# Patient Record
Sex: Male | Born: 1970
Health system: Southern US, Community
[De-identification: ages and names within clinical notes are randomized; demographics above are authoritative.]

## PROBLEM LIST (undated history)

## (undated) DIAGNOSIS — F319 Bipolar disorder, unspecified: Secondary | ICD-10-CM

## (undated) DIAGNOSIS — D649 Anemia, unspecified: Secondary | ICD-10-CM

## (undated) DIAGNOSIS — J189 Pneumonia, unspecified organism: Secondary | ICD-10-CM

## (undated) DIAGNOSIS — F32A Depression, unspecified: Secondary | ICD-10-CM

## (undated) DIAGNOSIS — Z87891 Personal history of nicotine dependence: Secondary | ICD-10-CM

## (undated) DIAGNOSIS — F419 Anxiety disorder, unspecified: Secondary | ICD-10-CM

## (undated) DIAGNOSIS — K219 Gastro-esophageal reflux disease without esophagitis: Secondary | ICD-10-CM

## (undated) DIAGNOSIS — J869 Pyothorax without fistula: Secondary | ICD-10-CM

## (undated) DIAGNOSIS — F329 Major depressive disorder, single episode, unspecified: Secondary | ICD-10-CM

## (undated) DIAGNOSIS — F209 Schizophrenia, unspecified: Secondary | ICD-10-CM

## (undated) DIAGNOSIS — I10 Essential (primary) hypertension: Secondary | ICD-10-CM

## (undated) HISTORY — PX: OTHER SURGICAL HISTORY: SHX169

## (undated) HISTORY — DX: Pyothorax without fistula: J86.9

---

## 2011-12-04 ENCOUNTER — Emergency Department (HOSPITAL_COMMUNITY): Payer: BC Managed Care – PPO

## 2011-12-04 ENCOUNTER — Emergency Department (HOSPITAL_COMMUNITY)
Admission: EM | Admit: 2011-12-04 | Discharge: 2011-12-06 | Disposition: A | Discharge status: Home / Self Care | Payer: BC Managed Care – PPO | Attending: Emergency Medicine | Admitting: Emergency Medicine

## 2011-12-04 ENCOUNTER — Encounter (HOSPITAL_COMMUNITY): Payer: Self-pay | Admitting: *Deleted

## 2011-12-04 DIAGNOSIS — F1910 Other psychoactive substance abuse, uncomplicated: Secondary | ICD-10-CM | POA: Insufficient documentation

## 2011-12-04 DIAGNOSIS — F29 Unspecified psychosis not due to a substance or known physiological condition: Secondary | ICD-10-CM | POA: Insufficient documentation

## 2011-12-04 LAB — CBC WITH DIFFERENTIAL/PLATELET
Eosinophils Absolute: 0.2 10*3/uL (ref 0.0–0.7)
Eosinophils Relative: 3 % (ref 0–5)
Lymphs Abs: 2.2 10*3/uL (ref 0.7–4.0)
MCH: 33.3 pg (ref 26.0–34.0)
MCV: 90.8 fL (ref 78.0–100.0)
Monocytes Absolute: 0.5 10*3/uL (ref 0.1–1.0)
Monocytes Relative: 6 % (ref 3–12)
Platelets: 188 10*3/uL (ref 150–400)
RBC: 5.52 MIL/uL (ref 4.22–5.81)

## 2011-12-04 LAB — RAPID URINE DRUG SCREEN, HOSP PERFORMED
Amphetamines: NOT DETECTED
Barbiturates: NOT DETECTED
Tetrahydrocannabinol: POSITIVE — AB

## 2011-12-04 LAB — URINALYSIS, ROUTINE W REFLEX MICROSCOPIC
Bilirubin Urine: NEGATIVE
Hgb urine dipstick: NEGATIVE
Protein, ur: NEGATIVE mg/dL
Urobilinogen, UA: 0.2 mg/dL (ref 0.0–1.0)

## 2011-12-04 LAB — BASIC METABOLIC PANEL
BUN: 12 mg/dL (ref 6–23)
Calcium: 9.2 mg/dL (ref 8.4–10.5)
Creatinine, Ser: 1.07 mg/dL (ref 0.50–1.35)
GFR calc non Af Amer: 86 mL/min — ABNORMAL LOW (ref >90)
Glucose, Bld: 104 mg/dL — ABNORMAL HIGH (ref 70–99)
Sodium: 144 mEq/L (ref 135–145)

## 2011-12-04 LAB — SALICYLATE LEVEL: Salicylate Lvl: <2 mg/dL — ABNORMAL LOW (ref 2.8–20.0)

## 2011-12-04 LAB — ETHANOL: Alcohol, Ethyl (B): 158 mg/dL — ABNORMAL HIGH (ref 0–11)

## 2011-12-04 MED ORDER — ACETAMINOPHEN 325 MG PO TABS
650.0000 mg | ORAL_TABLET | ORAL | Status: DC | PRN
  Administered 2011-12-05: 650 mg via ORAL
  Filled 2011-12-04: qty 2

## 2011-12-04 MED ORDER — IBUPROFEN 200 MG PO TABS
600.0000 mg | ORAL_TABLET | Freq: Three times a day (TID) | ORAL | Status: DC | PRN
  Administered 2011-12-05 – 2011-12-06 (×3): 600 mg via ORAL
  Filled 2011-12-04 (×3): qty 3

## 2011-12-04 MED ORDER — IBUPROFEN 800 MG PO TABS: 800.0000 mg | ORAL_TABLET | Freq: Once | ORAL | Status: DC

## 2011-12-04 MED ORDER — SODIUM CHLORIDE 0.9 % IV BOLUS (SEPSIS)
1000.0000 mL | Freq: Once | INTRAVENOUS | Status: AC
  Administered 2011-12-04: 1000 mL via INTRAVENOUS

## 2011-12-04 MED ORDER — ONDANSETRON HCL 8 MG PO TABS: 4.0000 mg | ORAL_TABLET | Freq: Three times a day (TID) | ORAL | Status: DC | PRN

## 2011-12-04 MED ORDER — ALUM & MAG HYDROXIDE-SIMETH 200-200-20 MG/5ML PO SUSP: 30.0000 mL | ORAL | Status: DC | PRN

## 2011-12-04 MED ORDER — LORAZEPAM 2 MG/ML IJ SOLN
1.0000 mg | Freq: Once | INTRAMUSCULAR | Status: AC
  Administered 2011-12-04: 1 mg via INTRAVENOUS
  Filled 2011-12-04: qty 1

## 2011-12-04 MED ORDER — ZOLPIDEM TARTRATE 5 MG PO TABS
5.0000 mg | ORAL_TABLET | Freq: Every evening | ORAL | Status: DC | PRN
  Administered 2011-12-04: 5 mg via ORAL
  Filled 2011-12-04: qty 1

## 2011-12-04 NOTE — ED Notes (Signed)
Family at bedside.  Pt. Stable, no change in his status.

## 2011-12-04 NOTE — ED Notes (Signed)
Went to see patient to notify of discharge. Pt continues with altered mental status. Does not talk, shaking upper extremities, not following commands. Dr. Alisa Graff made aware patient not able to be discharged at this time.

## 2011-12-04 NOTE — ED Notes (Signed)
Verl Bangs MD deciding whether patient is CDU appropriate

## 2011-12-04 NOTE — ED Notes (Signed)
CBG checked 94 

## 2011-12-04 NOTE — ED Provider Notes (Signed)
Assumed pt care in CDU.  Pt with altered mental status who was initially evaluated by Dr. Verl Bangs.  Pt apparently only wants to communicate through writing, although able to make verbal sounds.  He has an alcohol level of 158 and +marijuana in UDS.  Does not appears intoxicated but unsure his baseline.  Plan to monitor for 4-6 hrs and will d/c pending mental status condition.    7:55 AM Pt able to communicate through writing that he has experienced this particular sxs once before, lasting less than a day.  He is stressed. he is not drunk.  He prefers no drugs, he is not in pain, and he would like for [place] to contact his family at [phone].  On exam, pt appears tearful but alert.  Is tachycardic without M/R/G, tachypneic on exam without W/R/R, abd nontender, able to move all extremity with normal strength.  No trauma noted.    8:07 AM Attempt to contact family member via phone but was unsuccessful.  Will try again.  So far pt's labs are reassuring.  Will continue to monitor.  I suspect pt is having a mental breakdown.  8:38 AM I was able to talk to pt's girlfriend, who is in the waiting room.  According to girlfriend, pt did had 2 beers tonight but has been stressed out about exwife and custody of his kid.  Girlfriend does not acknowledge any SI/HI intent from patient.  However, he was "acting strange and noncommunicative" therefore she brought him to ER.  Girlfriend also sts pt is extremely sensitive to all medication and thinks Ativan that was given in ER seems to worsening his mental status.  However, she admits he has not behave himself prior to come to ER.  We will continue monitoring as appropriate.    Pt currently sleeping, VSS.    9:35 AM Pt appears irrated, and request for [place] to contact his mom, [phone].  I was able to successfully reached pt's mother through phone.  She admits pt has been under a lot of stress. She agrees to come to ER to see him.  Pt were notified.    10:18  AM Pt's parent is currently at bedside.  They acknowledge that pt is very sensitive to all medications, and also has "neuro-synapsis depression" disorder.  Also acknowledge that pt has been under a lot of mental stress.  Sts he used to be on psych medication which has helped tremendously.  Unsure which medicine.  Sts pt no longer f/u with psych.  However, pt has expressed "im loosing it" to mom recently in regard to stressed about finance, ex wife, shoulder pain despite surgeries, etc... I reassure family that we will monitor pt closely.  I also offer psych-eval through our [facility], once pt can verbalize.    11:53 AM   I have consulted with ACT team, who agrees to see pt for further management. Pt likely will need telepsych for further evaluation.    12:24 PM ACT team has seen and evaluate pt but were unable to obtain any hx from him.  They plan to consult telepsych.  Family member notify that pt's eyelid appears puffy.  On exam, eyelids are edematous bilaterally. No airway compromise. Lung's CTAB.  Pt appears to be in pain and holding R shoulder.  Non communicative.  Will give ibuprofen.    2:02 PM My attending has seen and evaluated the patient.  Our plan is to consult with ACT team for psych eval and placement.  Pt not  safe to be discharge.    2:54 PM Pt will be transfer to Psych ED for further management.    Results for orders placed during the hospital encounter of 12/04/11  CBC WITH DIFFERENTIAL      Component Value Range   WBC 7.8  4.0 - 10.5 K/uL   RBC 5.52  4.22 - 5.81 MIL/uL   Hemoglobin 18.4 (*) 13.0 - 17.0 g/dL   HCT 16.1  09.6 - 04.5 %   MCV 90.8  78.0 - 100.0 fL   MCH 33.3  26.0 - 34.0 pg   MCHC 36.7 (*) 30.0 - 36.0 g/dL   RDW 40.9  81.1 - 91.4 %   Platelets 188  150 - 400 K/uL   Neutrophils Relative 63  43 - 77 %   Neutro Abs 4.9  1.7 - 7.7 K/uL   Lymphocytes Relative 28  12 - 46 %   Lymphs Abs 2.2  0.7 - 4.0 K/uL   Monocytes Relative 6  3 - 12 %   Monocytes Absolute 0.5   0.1 - 1.0 K/uL   Eosinophils Relative 3  0 - 5 %   Eosinophils Absolute 0.2  0.0 - 0.7 K/uL   Basophils Relative 0  0 - 1 %   Basophils Absolute 0.0  0.0 - 0.1 K/uL  BASIC METABOLIC PANEL      Component Value Range   Sodium 144  135 - 145 mEq/L   Potassium 3.2 (*) 3.5 - 5.1 mEq/L   Chloride 104  96 - 112 mEq/L   CO2 25  19 - 32 mEq/L   Glucose, Bld 104 (*) 70 - 99 mg/dL   BUN 12  6 - 23 mg/dL   Creatinine, Ser 7.82  0.50 - 1.35 mg/dL   Calcium 9.2  8.4 - 95.6 mg/dL   GFR calc non Af Amer 86 (*) >90 mL/min   GFR calc Af Amer >90  >90 mL/min  URINALYSIS, ROUTINE W REFLEX MICROSCOPIC      Component Value Range   Color, Urine YELLOW  YELLOW   APPearance CLEAR  CLEAR   Specific Gravity, Urine 1.011  1.005 - 1.030   pH 6.0  5.0 - 8.0   Glucose, UA NEGATIVE  NEGATIVE mg/dL   Hgb urine dipstick NEGATIVE  NEGATIVE   Bilirubin Urine NEGATIVE  NEGATIVE   Ketones, ur NEGATIVE  NEGATIVE mg/dL   Protein, ur NEGATIVE  NEGATIVE mg/dL   Urobilinogen, UA 0.2  0.0 - 1.0 mg/dL   Nitrite NEGATIVE  NEGATIVE   Leukocytes, UA NEGATIVE  NEGATIVE  URINE RAPID DRUG SCREEN (HOSP PERFORMED)      Component Value Range   Opiates NONE DETECTED  NONE DETECTED   Cocaine NONE DETECTED  NONE DETECTED   Benzodiazepines NONE DETECTED  NONE DETECTED   Amphetamines NONE DETECTED  NONE DETECTED   Tetrahydrocannabinol POSITIVE (*) NONE DETECTED   Barbiturates NONE DETECTED  NONE DETECTED  ACETAMINOPHEN LEVEL      Component Value Range   Acetaminophen (Tylenol), Serum <15.0  10 - 30 ug/mL  SALICYLATE LEVEL      Component Value Range   Salicylate Lvl <2.0 (*) 2.8 - 20.0 mg/dL  ETHANOL      Component Value Range   Alcohol, Ethyl (B) 158 (*) 0 - 11 mg/dL  GLUCOSE, CAPILLARY      Component Value Range   Glucose-Capillary 94  70 - 99 mg/dL  ETHANOL      Component Value Range   Alcohol, Ethyl (  B) 77 (*) 0 - 11 mg/dL   Ct Head Wo Contrast  12/04/2011  *RADIOLOGY REPORT*  Clinical Data:  Unresponsive patient.   Altered mental status.  CT HEAD WITHOUT CONTRAST CT CERVICAL SPINE WITHOUT CONTRAST  Technique:  Multidetector CT imaging of the head and cervical spine was performed following the standard protocol without intravenous contrast.  Multiplanar CT image reconstructions of the cervical spine were also generated.  Comparison:   None  CT HEAD  Findings: No mass lesion, mass effect, midline shift, hydrocephalus, hemorrhage.  No territorial ischemia or acute infarction.  Mastoid air cells and paranasal sinuses appear within normal limits.  IMPRESSION: Negative CT head.  CT CERVICAL SPINE  Findings: Anatomic alignment of the cervical spine.  Occipital condyles appear within normal limits.  There is no cervical spine fracture or dislocation.  Small areas of heterotopic bone present adjacent to the spinous processes.  Lung apices appear within normal limits.  IMPRESSION: Negative cervical spine CT.  Original Report Authenticated By: Andreas Newport, M.D.   Ct Cervical Spine Wo Contrast  12/04/2011  *RADIOLOGY REPORT*  Clinical Data:  Unresponsive patient.  Altered mental status.  CT HEAD WITHOUT CONTRAST CT CERVICAL SPINE WITHOUT CONTRAST  Technique:  Multidetector CT imaging of the head and cervical spine was performed following the standard protocol without intravenous contrast.  Multiplanar CT image reconstructions of the cervical spine were also generated.  Comparison:   None  CT HEAD  Findings: No mass lesion, mass effect, midline shift, hydrocephalus, hemorrhage.  No territorial ischemia or acute infarction.  Mastoid air cells and paranasal sinuses appear within normal limits.  IMPRESSION: Negative CT head.  CT CERVICAL SPINE  Findings: Anatomic alignment of the cervical spine.  Occipital condyles appear within normal limits.  There is no cervical spine fracture or dislocation.  Small areas of heterotopic bone present adjacent to the spinous processes.  Lung apices appear within normal limits.  IMPRESSION: Negative  cervical spine CT.  Original Report Authenticated By: Andreas Newport, M.D.      Fayrene Helper, PA-C 12/04/11 1454

## 2011-12-04 NOTE — ED Notes (Signed)
Pt from front of ER fell backwards out of the car, not responding. Per family pt was drinking and went to car lie down. Upon arrival pupils fixed and dilated, pt with tremors, not responding to verbal, tactile stimuli. Eyes red. Extremities cold.  c-collar placed.

## 2011-12-04 NOTE — ED Notes (Signed)
Patient is resting comfortably. 

## 2011-12-04 NOTE — Progress Notes (Signed)
Responded to pg to intervene with family.  Introduced myself to pt and family in rm- Mother, father, brother, fiance.  Fiance tearful, pt resting and quiet.  Offered pastoral discussion @ keeping pt environment calm, and offered use of family room to help them process their own feelings.  Fiance and mother agreed and we talked in consult rm C about letting pt have some space and calm to rest based on family stories.  Mother and fiance agreed that fiance would get keys and go home for a while.  On return, family pastor was also in room.  Advised staff of intervention and plan for some family to leave.  Will follow-up as needed or requested.

## 2011-12-04 NOTE — ED Provider Notes (Addendum)
History     CSN: [id number]  Arrival date & time 12/04/11  4098   First MD Initiated Contact with Patient 12/04/11 0304      Chief Complaint  Patient presents with  . Altered Mental Status    (Consider location/radiation/quality/duration/timing/severity/associated sxs/prior treatment) Patient is a 41 y.o. male presenting with altered mental status. The history is limited by the condition of the patient.  Altered Mental Status This is a new problem. The current episode started less than 1 hour ago. The problem occurs constantly. The problem has not changed since onset.Associated symptoms comments: Pt with fall from car,  Odor of etoh.  Altered, mubling words.    No past medical history on file.  No past surgical history on file.  No family history on file.  History  Substance Use Topics  . Smoking status: Not on file  . Smokeless tobacco: Not on file  . Alcohol Use: Not on file      Review of Systems  Unable to perform ROS: Mental status change  Psychiatric/Behavioral: Positive for altered mental status.    Allergies  Review of patient's allergies indicates not on file.  Home Medications  No current outpatient prescriptions on file.  BP 132/83  Pulse 87  Resp 13  SpO2 100%  Physical Exam  Constitutional: He appears well-developed and well-nourished. He appears lethargic.  HENT:  Head: Normocephalic and atraumatic.  Eyes: Conjunctivae are normal. Pupils are equal, round, and reactive to light.  Neck: Normal range of motion. Neck supple.  Cardiovascular: Normal rate, regular rhythm, normal heart sounds and intact distal pulses.   Pulmonary/Chest: Effort normal and breath sounds normal.  Abdominal: Soft. Bowel sounds are normal.  Neurological: He appears lethargic. He displays no atrophy and no tremor. He exhibits normal muscle tone. He displays no seizure activity. GCS eye subscore is 4. GCS verbal subscore is 3. GCS motor subscore is 6.  Skin: Skin is warm  and dry.  Psychiatric: He has a normal mood and affect. His behavior is normal. Judgment and thought content normal.    ED Course  Procedures (including critical care time)  Labs Reviewed  CBC WITH DIFFERENTIAL - Abnormal; Notable for the following:    Hemoglobin 18.4 (*)     MCHC 36.7 (*)  CORRECTED FOR COLD AGGLUTININS   All other components within normal limits  URINALYSIS, ROUTINE W REFLEX MICROSCOPIC  BASIC METABOLIC PANEL  URINE RAPID DRUG SCREEN (HOSP PERFORMED)  DRUG SCREEN PANEL (SERUM)  ACETAMINOPHEN LEVEL  SALICYLATE LEVEL  ETHANOL   No results found.   No diagnosis found.   Date: 12/04/2011  Rate: 94  Rhythm: normal sinus rhythm  QRS Axis: normal  Intervals: normal  ST/T Wave abnormalities: normal  Conduction Disutrbances: none  Narrative Interpretation: unremarkable     MDM  + ams of unkown etiology.  Placed on immobilization.  Await ct, labs.  Some purposeful movement noted. Will reassess   Improved. Pt still no vocalizing,  Will repeat etoh,  Reeval,  Hold in cdu     Allean Montfort Lytle Michaels, MD 12/04/11 0710  Dontavis Tschantz Lytle Michaels, MD 12/04/11 [phone]

## 2011-12-04 NOTE — ED Notes (Signed)
No old EKG. New EKG given to Dr Verl Bangs

## 2011-12-04 NOTE — ED Notes (Addendum)
Per fiance: Pt was at a [employer] and was not feeling well after he had been drinking.  He went to the car to lay down. When they came out of the club awhile later he was nonverbal and unresponsive .  Pt is awake. Nonverbal. Seizure like behavior. Pt hold's is breath when frustrated. Pt can right answers to simple questions and short sentences. Pt denies any drug use. Unable to verbalize allergies, medical history, or pain. Reports SOB. SpO2 100% on 2L [place]. Fiance in waiting room. Cousin at bedside.  Safety sitter at bedside.

## 2011-12-04 NOTE — ED Notes (Signed)
Pt. Continues to have no change in status, vitals stable, pt. Is alert and oriented to self and place.,  but will not communicate verbally only will write.

## 2011-12-04 NOTE — ED Notes (Signed)
Patient belongings including shorts, shoes, socks, wallet, and watch at bedside in pt. Belonging bag.

## 2011-12-04 NOTE — ED Notes (Signed)
Pt. Arrived from Pod B.  Placed on monitor, vitals stable.  Pt. Is alert and oriented, follows commands. Unable to speak.  Pt. Verbalizes needs by writing.  Pt. Denies any pain.  Pt. Does shake his head yes  when asked if he can speak.   Will continue to monitor pt. Pt. Is in NAD and skin is w/d/p.

## 2011-12-04 NOTE — ED Provider Notes (Signed)
Medical screening examination/treatment/procedure(s) were conducted as a shared visit with non-physician practitioner(s) and myself.  I personally evaluated the patient during the encounter Patient's with the symptoms that is most suggestive of psychiatric at this time.  Initially when he was seen he had a workup including labs and head CT which were all within normal limits. Patient was positive for alcohol and marijuana. However as time has gone on patient initially would only write to communicate and refused to speak, but now patient measures his in to speak the right. Per his family he has had severe depression in the past requiring antidepressants. Recently his ex-wife remarried and is going back on her verbal agreements with their children. The patient told his mother he thought he may be having a breakdown. At this point feel his symptomatology is due to psychiatric and not neurologic or medical findings. Will attempt placement for the patient for further psychiatric treatment  Gwyneth Sprout, MD 12/04/11 1514

## 2011-12-04 NOTE — BH Assessment (Addendum)
Assessment Note   Jeremy Dillon is an 41 y.o. male that presented to [facility] after the pt not acting like himself per pt's girlfriend.  Per pt's family he has been under stress from a previous divorce, his ex-wife and custody of his kids.  Pt presented after haing 2 beers per girlfriend and having smoked marijuana 3 days ago per girlfriend for ongoing shoulder pain.  Writer was asked to see pt once medically cleared.  Pt was unresponsive, appeared bizarre and was rolling his eyes around when writer attempted to assess pt.  All of the information gathered was from the mother and from previous notes.  Pt was writing responses on a board earlier, refusing to talk, but now, not responding to anyone at all.  Per pt's mother, pt had an episode like this 2 years ago when pt was getting a divorce.  Per mother, pt has been diagnosed with "neourolgical depression," and has not had any MH treatment before other than getting psychotropic meds from an unknown provider 2 years ago while going through divorce.   Consulted with PA Fayrene Helper, who agreed a telepsych was appropriate for further evaluation and recommendations.  Consulted with EDP Plunkett, who feels inpatient recommendation is appropriate.  Will run for possible admission.  Telepsych will not be completed, as pt not talking.  Completed assessment, assessment notification and faxed to [facility] to log.  Pt pending [facility].  Updated ED staff.  Axis I: Anxiety Disorder NOS Axis II: Deferred Axis III: No past medical history on file. Axis IV: other psychosocial or environmental problems and problems with primary support group Axis V: 21-30 behavior considerably influenced by delusions or hallucinations OR serious impairment in judgment, communication OR inability to function in almost all areas  Past Medical History: No past medical history on file.  No past surgical history on file.  Family History: No family history on file.  Social History:  does not have a  smoking history on file. He does not have any smokeless tobacco history on file. He reports that he drinks alcohol. He reports that he uses illicit drugs (Marijuana).  Additional Social History:  Alcohol / Drug Use Pain Medications: na Prescriptions: na Over the Counter: na History of alcohol / drug use?:  (unknown - pt drank 2 beers, smoked THC 3 days ago per family) Longest period of sobriety (when/how long): unknown Negative Consequences of Use:  (unknown) Withdrawal Symptoms:  (UTA)  CIWA: CIWA-Ar BP: 140/84 mmHg Pulse Rate: 82  COWS:    Allergies:  Allergies  Allergen Reactions  . Erythromycin     Cannot take any mycins  . Penicillins     Cannot take any cillins  . Sulfa Antibiotics     Cannot take any sulfa drugs    Home Medications:  (Not in a hospital admission)  OB/GYN Status:  No LMP for male patient.  General Assessment Data Location of Assessment: [facility] ED Living Arrangements: Spouse/significant other Can pt return to current living arrangement?: Yes Admission Status: Voluntary Is patient capable of signing voluntary admission?: Yes Transfer from: Acute Hospital Referral Source: Self/Family/Friend  Education Status Is patient currently in school?: No  Risk to self Suicidal Ideation:  (UTA) Suicidal Intent:  (UTA) Is patient at risk for suicide?:  (UTA) Suicidal Plan?:  (UTA) Access to Means:  (UTA) What has been your use of drugs/alcohol within the last 12 months?: Pt had 2 beers last night, used THC 3 days ago per family Previous Attempts/Gestures: No (per family)  How many times?: 0  (per family) Other Self Harm Risks: UTA Triggers for Past Attempts: Unknown Intentional Self Injurious Behavior:  (UTA) Family Suicide History: No (per mother) Recent stressful life event(s): Conflict (Comment);Divorce;Turmoil (Comment) (divorce issues with wife and kids, under stress from this) Persecutory voices/beliefs?:  (UTA) Depression: Yes Depression Symptoms:   (UTA - pt has depression per parents) Substance abuse history and/or treatment for substance abuse?: No Suicide prevention information given to non-admitted patients:  (UTA)  Risk to Others Homicidal Ideation:  (UTA) Thoughts of Harm to Others:  (UTA) Current Homicidal Intent:  (UTA) Current Homicidal Plan:  (UTA) Access to Homicidal Means:  (UTA) Identified Victim:  (UTA) History of harm to others?:  (UTA) Assessment of Violence: None Noted Violent Behavior Description: na - pt not talking, uncommunicative Does patient have access to weapons?:  (Unknown) Criminal Charges Pending?: No (per pt's mother) Does patient have a court date: No (per pt's mother)  Psychosis Hallucinations:  (UTA) Delusions:  (UTA)  Mental Status Report Appear/Hygiene: Bizarre Eye Contact: Other (Comment) (None - pt rolling around eyes in head) Motor Activity: Psychomotor retardation Speech: Unable to assess Level of Consciousness: Unable to assess Mood: Other (Comment) (UTA) Affect: Unable to Assess Anxiety Level:  (UTA) Thought Processes:  (UTA) Judgement:  (UTA) Orientation: Unable to assess Obsessive Compulsive Thoughts/Behaviors:  (UTA)  Cognitive Functioning Concentration:  (UTA) Memory:  (UTA) IQ:  (UTA) Insight:  (UTA) Impulse Control:  (UTA) Appetite:  (UTA) Weight Loss:  (UTA) Weight Gain:  (UTA) Sleep:  (UTA) Total Hours of Sleep:  (UTA) Vegetative Symptoms:  (UTA)  ADLScreening [facility] Assessment Services) Patient's cognitive ability adequate to safely complete daily activities?: Yes Patient able to express need for assistance with ADLs?: Yes Independently performs ADLs?: Yes (appropriate for developmental age)  Abuse/Neglect [facility]) Physical Abuse: Denies (pt's mother denies) Verbal Abuse: Denies (pt's mother denies) Sexual Abuse: Denies (pt's mother denies)  Prior Inpatient Therapy Prior Inpatient Therapy: No (per mother) Prior Therapy Dates: na Prior Therapy  Facilty/Provider(s): na Reason for Treatment: na  Prior Outpatient Therapy Prior Outpatient Therapy: Yes Prior Therapy Dates: 2 years ago Prior Therapy Facilty/Provider(s): Unknown provider Reason for Treatment: Depression per mother - pt given psychotropic medication  ADL Screening (condition at time of admission) Patient's cognitive ability adequate to safely complete daily activities?: Yes Patient able to express need for assistance with ADLs?: Yes Independently performs ADLs?: Yes (appropriate for developmental age) Weakness of Legs: None Weakness of Arms/Hands: None  Home Assistive Devices/Equipment Home Assistive Devices/Equipment: None    Abuse/Neglect Assessment (Assessment to be complete while patient is alone) Physical Abuse: Denies (pt's mother denies) Verbal Abuse: Denies (pt's mother denies) Sexual Abuse: Denies (pt's mother denies) Exploitation of patient/patient's resources: Denies (pt's mother denies) Self-Neglect: Denies (pt's mother denies) Possible abuse reported to::  (na) Values / Beliefs Cultural Requests During Hospitalization:  (UTA) Spiritual Requests During Hospitalization:  (UTA) Consults Spiritual Care Consult Needed:  (unknown) Social Work Librarian, academic Needed:  (unknown) Merchant navy officer (For Healthcare) Advance Directive: Patient does not have advance directive    Additional Information 1:1 In Past 12 Months?:  (UTA) CIRT Risk:  (UTA) Elopement Risk:  (UTA) Does patient have medical clearance?: Yes     Disposition:  Pending [facility]   On Site Evaluation by:   Reviewed with Physician:  PA Greta Doom Tran/Plunkett   Caryl Comes 12/04/2011 12:52 PM

## 2011-12-05 MED ORDER — SERTRALINE HCL 25 MG PO TABS
25.0000 mg | ORAL_TABLET | Freq: Every day | ORAL | Status: DC
  Administered 2011-12-05 – 2011-12-06 (×2): 25 mg via ORAL
  Filled 2011-12-05 (×2): qty 1

## 2011-12-05 MED ORDER — LORAZEPAM 2 MG/ML IJ SOLN: 1.0000 mg | Freq: Two times a day (BID) | INTRAMUSCULAR | Status: DC | PRN

## 2011-12-05 MED ORDER — RISPERIDONE 0.5 MG PO TABS
0.5000 mg | ORAL_TABLET | Freq: Two times a day (BID) | ORAL | Status: DC
  Administered 2011-12-05 – 2011-12-06 (×3): 0.5 mg via ORAL
  Filled 2011-12-05 (×3): qty 1

## 2011-12-05 MED ORDER — LORAZEPAM 1 MG PO TABS
1.0000 mg | ORAL_TABLET | Freq: Two times a day (BID) | ORAL | Status: DC | PRN
  Administered 2011-12-05 (×2): 1 mg via ORAL
  Filled 2011-12-05 (×2): qty 1

## 2011-12-05 NOTE — ED Notes (Signed)
In to inform pt of move and pt father seems anxious. Tells staff "dont turn the light on it sets him off". Pt continues not speaking. He does have eye contact when talked to but barely nods acknowlegement of understanding. Pt placed in paper scrubs by ida the tech. Pt compliant. Pt and his father moved to behavioral health area of ed. (pod c)

## 2011-12-05 NOTE — BH Assessment (Signed)
[facility] Assessment Progress Note      Pt's father requested that we be aware that a more detailed history can be provided by past psychiatric providers.  These are two: Dr. Marjory Lies-  [phone] And Dr. Loretha Stapler-  [phone]

## 2011-12-05 NOTE — ED Notes (Signed)
Patient's girlfriend at bedside patient currently is sleeping resting comfortably.

## 2011-12-05 NOTE — ED Notes (Signed)
Discussed with father and patient. Able to given motrin every 8 hours and tylenol every 4 hours.

## 2011-12-05 NOTE — ED Notes (Signed)
Spoke with ed. Will accept pt into room 26

## 2011-12-05 NOTE — ED Notes (Signed)
Family at bedside. 

## 2011-12-05 NOTE — ED Provider Notes (Signed)
Jeremy Dillon is a 41 y.o. male who is here for placement. He continues to be nonverbal. He attempts to converse with extending and hand motions. His friend is here in the room with him.  He has been seen by [facility] psychiatry, who advises starting on Zoloft, Ativan, and risperidone. They also recommend psychiatric admission  Flint Melter, MD 12/05/11 1422

## 2011-12-05 NOTE — ED Notes (Signed)
Patient awake requested tylenol when nurse asked if wanting tylenol patient nodded head up and down.

## 2011-12-05 NOTE — ED Notes (Signed)
Patient's girlfriend at bedside and patient texted girlfriend pain present.  Asked patient if he would want tylenol at this time patient nodded head up and down.

## 2011-12-05 NOTE — ED Notes (Signed)
Patient sleeping and girlfriend at bedside stated not to wake up patient at this time.

## 2011-12-05 NOTE — ED Notes (Signed)
Family member called spoke with patient's father who is at bedside.

## 2011-12-05 NOTE — ED Notes (Signed)
Patient father at bedside stated patient has history of right shoulder pain.  When asked if patient has pain patient nodded head up and down.  Skin warm and intact radial pulse +2. Patient has full range of motion.

## 2011-12-05 NOTE — BH Assessment (Signed)
Assessment Note   Jeremy Dillon is an 41 y.o. male. Upon reassessment and Telepsych results, pt remains unstable and is not psychiatrically cleared.  Pt remains electively mute and only corresponds through his Father or a by written communication.  Pt is irritable when awakened and is easy to startle and irritate.  Pt's family remains demanding and sure that they know what is best for pt.  They are refusing to consider [facility] as an option and have questioned his disposition repeatedly throughout the morning.  Pt remains on the list to be run at [facility].  Telespysch results were also provided.      Jeremy Dillon is an 41 y.o. male that presented to [facility] after the pt not acting like himself per pt's girlfriend. Per pt's family he has been under stress from a previous divorce, his ex-wife and custody of his kids. Pt presented after haing 2 beers per girlfriend and having smoked marijuana 3 days ago per girlfriend for ongoing shoulder pain. Writer was asked to see pt once medically cleared. Pt was unresponsive, appeared bizarre and was rolling his eyes around when writer attempted to assess pt. All of the information gathered was from the mother and from previous notes. Pt was writing responses on a board earlier, refusing to talk, but now, not responding to anyone at all. Per pt's mother, pt had an episode like this 2 years ago when pt was getting a divorce. Per mother, pt has been diagnosed with "neourolgical depression," and has not had any MH treatment before other than getting psychotropic meds from an unknown provider 2 years ago while going through divorce. Consulted with PA Fayrene Helper, who agreed a telepsych was appropriate for further evaluation and recommendations. Consulted with EDP Plunkett, who feels inpatient recommendation is appropriate. Will run for possible admission. Telepsych will not be completed, as pt not talking. Completed assessment, assessment notification and faxed to [facility] to log. Pt  pending [facility]. Updated ED staff.   Axis I: Unspecified Psychosis Axis II: Deferred Axis III: No past medical history on file. Axis IV: other psychosocial or environmental problems and problems related to social environment Axis V: 21-30 behavior considerably influenced by delusions or hallucinations OR serious impairment in judgment, communication OR inability to function in almost all areas  Past Medical History: No past medical history on file.  No past surgical history on file.  Family History: No family history on file.  Social History:  does not have a smoking history on file. He does not have any smokeless tobacco history on file. He reports that he drinks alcohol. He reports that he uses illicit drugs (Marijuana).  Additional Social History:  Alcohol / Drug Use Pain Medications: na Prescriptions: na Over the Counter: na History of alcohol / drug use?:  (unknown - pt drank 2 beers, smoked THC 3 days ago per family) Longest period of sobriety (when/how long): unknown Negative Consequences of Use:  (unknown) Withdrawal Symptoms:  (UTA)  CIWA: CIWA-Ar BP: 123/68 mmHg Pulse Rate: 69  COWS:    Allergies:  Allergies  Allergen Reactions  . Erythromycin     Cannot take any mycins  . Penicillins     Cannot take any cillins  . Sulfa Antibiotics     Cannot take any sulfa drugs    Home Medications:  (Not in a hospital admission)  OB/GYN Status:  No LMP for male patient.  General Assessment Data Location of Assessment: [facility] ED Living Arrangements: Spouse/significant other Can pt return to  current living arrangement?: Yes Admission Status: Voluntary Is patient capable of signing voluntary admission?: Yes Transfer from: Acute Hospital Referral Source: Self/Family/Friend  Education Status Is patient currently in school?: No  Risk to self Suicidal Ideation: No Suicidal Intent: No (UTA) Is patient at risk for suicide?: No Suicidal Plan?: No Access to Means: No What  has been your use of drugs/alcohol within the last 12 months?: has been drinking and smoking Marijuana Previous Attempts/Gestures: No How many times?: 0  Other Self Harm Risks: unpredictable Triggers for Past Attempts: Unpredictable Intentional Self Injurious Behavior: None Family Suicide History: No Recent stressful life event(s): Conflict (Comment);Divorce;Loss (Comment);Turmoil (Comment) Persecutory voices/beliefs?: No Depression: Yes Depression Symptoms: Loss of interest in usual pleasures;Feeling worthless/self pity Substance abuse history and/or treatment for substance abuse?: No Suicide prevention information given to non-admitted patients: Not applicable  Risk to Others Homicidal Ideation: No Thoughts of Harm to Others: No Current Homicidal Intent: No Current Homicidal Plan: No Access to Homicidal Means: No Identified Victim: none History of harm to others?: No Assessment of Violence: None Noted Violent Behavior Description: n/a Does patient have access to weapons?: No Criminal Charges Pending?: No Does patient have a court date: No  Psychosis Hallucinations: None noted Delusions: None noted  Mental Status Report Appear/Hygiene: Bizarre Eye Contact: Good Motor Activity: Psychomotor retardation;Freedom of movement Speech: Elective mutism Level of Consciousness: Unable to assess Mood: Depressed;Empty;Preoccupied;Worthless, low self-esteem Affect: Unable to Assess Anxiety Level: Moderate Thought Processes: Irrelevant Judgement: Impaired Orientation: Unable to assess Obsessive Compulsive Thoughts/Behaviors: Severe  Cognitive Functioning Concentration: Decreased Memory: Recent Impaired;Remote Impaired IQ: Average Insight: Poor Impulse Control: Poor Appetite: Fair Weight Loss: 0  Weight Gain: 0  Sleep: No Change Total Hours of Sleep:  (unknown) Vegetative Symptoms: Decreased grooming;Staying in bed  ADLScreening [facility] Assessment Services) Patient's  cognitive ability adequate to safely complete daily activities?: Yes Patient able to express need for assistance with ADLs?: Yes Independently performs ADLs?: Yes (appropriate for developmental age)  Abuse/Neglect [facility]) Physical Abuse: Denies Verbal Abuse: Denies Sexual Abuse: Denies  Prior Inpatient Therapy Prior Inpatient Therapy: No Prior Therapy Dates: na Prior Therapy Facilty/Provider(s): na Reason for Treatment: na  Prior Outpatient Therapy Prior Outpatient Therapy: Yes Prior Therapy Dates: 2 years ago Prior Therapy Facilty/Provider(s): Unknown provider Reason for Treatment: Depression per mother - pt given psychotropic medication  ADL Screening (condition at time of admission) Patient's cognitive ability adequate to safely complete daily activities?: Yes Patient able to express need for assistance with ADLs?: Yes Independently performs ADLs?: Yes (appropriate for developmental age) Weakness of Legs: None Weakness of Arms/Hands: None  Home Assistive Devices/Equipment Home Assistive Devices/Equipment: None    Abuse/Neglect Assessment (Assessment to be complete while patient is alone) Physical Abuse: Denies Verbal Abuse: Denies Sexual Abuse: Denies Exploitation of patient/patient's resources: Denies (pt's mother denies) Self-Neglect: Denies (pt's mother denies) Possible abuse reported to::  (na) Values / Beliefs Cultural Requests During Hospitalization:  (UTA) Spiritual Requests During Hospitalization:  (UTA) Consults Spiritual Care Consult Needed:  (unknown) Social Work Librarian, academic Needed:  (unknown) Merchant navy officer (For Healthcare) Advance Directive: Patient does not have advance directive    Additional Information 1:1 In Past 12 Months?: Yes CIRT Risk: No Elopement Risk: No Does patient have medical clearance?: Yes     Disposition: To be determined by [facility]. Disposition Disposition of Patient: Referred to;Inpatient treatment program Type of inpatient  treatment program: Adult Other disposition(s): Other (Comment) (Pending [facility]) Patient referred to: Other (Comment) ([facility]ending [facility])  On Site Evaluation by:   Reviewed  with Physician:     Angelica Ran 12/05/2011 3:16 PM

## 2011-12-05 NOTE — ED Notes (Signed)
Patient having tele psych with father at bedside

## 2011-12-05 NOTE — ED Notes (Signed)
Patient's father requested to placed oxygen [place] on patient.  Patient wrote trouble breathing. Pulses ox room air 99% no distress airway intact bilateral equal chest rise and fall.

## 2011-12-06 MED ORDER — RISPERIDONE 0.5 MG PO TABS: 0.5000 mg | ORAL_TABLET | Freq: Two times a day (BID) | ORAL | Status: DC

## 2011-12-06 MED ORDER — SERTRALINE HCL 25 MG PO TABS: 25.0000 mg | ORAL_TABLET | Freq: Every day | ORAL | Status: DC

## 2011-12-06 NOTE — ED Notes (Signed)
Resting quietly voices no complaints at this time. 

## 2011-12-06 NOTE — ED Notes (Signed)
Dr. Freida Busman and Berna Spare with ACT Team in to see the patient.

## 2011-12-06 NOTE — ED Notes (Addendum)
Patient now awake alert and talking eating well girlfriend present. Patient states 'I am feeling better." Up to restroom and he tolerated well. Dr. Freida Busman notified of the change.

## 2011-12-06 NOTE — ED Notes (Signed)
Patient discharged with his girlfriend. NAD noted at the time of discharge.

## 2011-12-06 NOTE — ED Provider Notes (Cosign Needed Addendum)
Pt started on meds yesterday for his psychiatyric condition, vitals stable, awaiting placment  Jeremy Baker, MD 12/06/11 0748  1:54 PM Pt now awake and alert. His family states that he is at baseline. Will continue on his current meds and pt to follow up with dr. Jerald Kief next week. He will sign a no harm contract  Jeremy Baker, MD 12/06/11 1356

## 2011-12-06 NOTE — ED Notes (Signed)
Patient appears to be sleeping father at bedside.

## 2011-12-06 NOTE — BH Assessment (Signed)
Assessment Note   Jeremy Dillon is an 41 y.o. male.  Patient was seen by this clinician and Jeremy Dillon (Jeremy).  Patient appears to be alert and oriented.  He is able to contract for safety.  He is surrounded by family who promise that they will assist him with follow up discussed.  Patient said that he is going to contact Jeremy Dillon, his former psychiatrist in [place], on Monday the 19th.  Patient denies any current SI, HI or A/V hallucinations.  He is talking and currently lucid and alert.  Also hungry as he is eating food.  Patient is able to contract for safety.  Jeremy Dillon did supply patient with list of medications that he got whilst in [facility] for this episode.  Patient will sign a consent to release with former psychiatrist to get records when he follows through on Monday.  Patient did sign a "no harm contract."  He was discharged and left with family and fiance.  Previous Notes: Jeremy Dillon is an 41 y.o. male. Upon reassessment and Telepsych results, pt remains unstable and is not psychiatrically cleared. Pt remains electively mute and only corresponds through his Father or a by written communication. Pt is irritable when awakened and is easy to startle and irritate. Pt's family remains demanding and sure that they know what is best for pt. They are refusing to consider [facility] as an option and have questioned his disposition repeatedly throughout the morning. Pt remains on the list to be run at [facility]. Telespysch results were also provided.   Jeremy Dillon is an 41 y.o. male that presented to [facility] after the pt not acting like himself per pt's girlfriend. Per pt's family he has been under stress from a previous divorce, his ex-wife and custody of his kids. Pt presented after haing 2 beers per girlfriend and having smoked marijuana 3 days ago per girlfriend for ongoing shoulder pain. Writer was asked to see pt once medically cleared. Pt was unresponsive, appeared bizarre and was rolling his eyes  around when writer attempted to assess pt. All of the information gathered was from the mother and from previous notes. Pt was writing responses on a board earlier, refusing to talk, but now, not responding to anyone at all. Per pt's mother, pt had an episode like this 2 years ago when pt was getting a divorce. Per mother, pt has been diagnosed with "neourolgical depression," and has not had any MH treatment before other than getting psychotropic meds from an unknown provider 2 years ago while going through divorce. Consulted with Jeremy Dillon, who agreed a telepsych was appropriate for further evaluation and recommendations. Consulted with Jeremy Dillon, who feels inpatient recommendation is appropriate. Will run for possible admission. Telepsych will not be completed, as pt not talking. Completed assessment, assessment notification and faxed to [facility] to log. Pt pending [facility]. Updated ED staff.  Axis I: Anxiety Disorder NOS Axis II: Deferred Axis III: No past medical history on file. Axis IV: other psychosocial or environmental problems Axis V: 51-60 moderate symptoms  Past Medical History: No past medical history on file.  No past surgical history on file.  Family History: No family history on file.  Social History:  does not have a smoking history on file. He does not have any smokeless tobacco history on file. He reports that he drinks alcohol. He reports that he uses illicit drugs (Marijuana).  Additional Social History:  Alcohol / Drug Use Pain Medications: na Prescriptions: na  Over the Counter: na History of alcohol / drug use?:  (unknown - pt drank 2 beers, smoked THC 3 days ago per family) Longest period of sobriety (when/how long): unknown Negative Consequences of Use:  (unknown) Withdrawal Symptoms:  (UTA)  CIWA: CIWA-Ar BP: 117/71 mmHg Pulse Rate: 51  COWS:    Allergies:  Allergies  Allergen Reactions  . Erythromycin     Cannot take any mycins  . Penicillins     Cannot  take any cillins  . Sulfa Antibiotics     Cannot take any sulfa drugs    Home Medications:  (Not in a hospital admission)  OB/GYN Status:  No LMP for male patient.  General Assessment Data Location of Assessment: [facility] ED ACT Assessment: Yes Living Arrangements: Spouse/significant other Can pt return to current living arrangement?: Yes Admission Status: Voluntary Is patient capable of signing voluntary admission?: Yes Transfer from: Acute Hospital Referral Source: Self/Family/Friend  Education Status Is patient currently in school?: No  Risk to self Suicidal Ideation: No Suicidal Intent: No Is patient at risk for suicide?: No Suicidal Plan?: No Access to Means: No What has been your use of drugs/alcohol within the last 12 months?: Has been drinking and using THC Previous Attempts/Gestures: No How many times?: 0  Other Self Harm Risks: Unknown Triggers for Past Attempts: Unpredictable Intentional Self Injurious Behavior: None Family Suicide History: No Recent stressful life event(s): Divorce (Custody issues w/ former wife) Persecutory voices/beliefs?: No Depression: Yes Depression Symptoms: Loss of interest in usual pleasures;Feeling worthless/self pity Substance abuse history and/or treatment for substance abuse?: Yes Suicide prevention information given to non-admitted patients: Not applicable  Risk to Others Homicidal Ideation: No Thoughts of Harm to Others: No Current Homicidal Intent: No Current Homicidal Plan: No Access to Homicidal Means: No Identified Victim: No one History of harm to others?: No Assessment of Violence: None Noted Violent Behavior Description: None Does patient have access to weapons?: No Criminal Charges Pending?: No Does patient have a court date: No  Psychosis Hallucinations: None noted Delusions: None noted  Mental Status Report Appear/Hygiene: Improved Eye Contact: Good Motor Activity: Freedom of movement;Unremarkable Speech:  Logical/coherent Level of Consciousness: Alert Mood: Depressed Affect: Sad Anxiety Level: Minimal Thought Processes: Coherent;Relevant Judgement: Unimpaired Orientation: Person;Place;Time;Situation Obsessive Compulsive Thoughts/Behaviors: Minimal  Cognitive Functioning Concentration: Normal Memory: Recent Intact;Remote Intact IQ: Average Insight: Fair Impulse Control: Poor Appetite: Good Weight Loss: 0  Weight Gain: 0  Sleep: No Change Total Hours of Sleep:  (Unknown) Vegetative Symptoms: Decreased grooming  ADLScreening [facility] Assessment Services) Patient's cognitive ability adequate to safely complete daily activities?: Yes Patient able to express need for assistance with ADLs?: Yes Independently performs ADLs?: Yes (appropriate for developmental age)  Abuse/Neglect [facility]) Physical Abuse: Denies Verbal Abuse: Denies Sexual Abuse: Denies  Prior Inpatient Therapy Prior Inpatient Therapy: No Prior Therapy Dates: na Prior Therapy Facilty/Provider(s): na Reason for Treatment: na  Prior Outpatient Therapy Prior Outpatient Therapy: Yes Prior Therapy Dates: 2 years ago Prior Therapy Facilty/Provider(s): Jeremy Dillon in [place] Reason for Treatment: Depression per mother - pt given psychotropic medication  ADL Screening (condition at time of admission) Patient's cognitive ability adequate to safely complete daily activities?: Yes Patient able to express need for assistance with ADLs?: Yes Independently performs ADLs?: Yes (appropriate for developmental age) Weakness of Legs: None Weakness of Arms/Hands: None  Home Assistive Devices/Equipment Home Assistive Devices/Equipment: None    Abuse/Neglect Assessment (Assessment to be complete while patient is alone) Physical Abuse: Denies Verbal Abuse: Denies Sexual Abuse: Denies  Exploitation of patient/patient's resources: Denies (pt's mother denies) Self-Neglect: Denies (pt's mother denies) Possible abuse reported to::   (na) Values / Beliefs Cultural Requests During Hospitalization:  (UTA) Spiritual Requests During Hospitalization:  (UTA) Consults Spiritual Care Consult Needed:  (unknown) Social Work Librarian, academic Needed:  (unknown) Merchant navy officer (For Healthcare) Advance Directive: Patient does not have advance directive    Additional Information 1:1 In Past 12 Months?: No CIRT Risk: No Elopement Risk: No Does patient have medical clearance?: Yes     Disposition:  Disposition Disposition of Patient: Outpatient treatment;Referred to Type of inpatient treatment program: Adult Type of outpatient treatment:  (Pt will be making appt w/ former psychiatrist) Other disposition(s): Other (Comment) Patient referred to:  (Referred back to fomer psychiatrist Jeremy Dillon in [place])  On Site Evaluation by:   Reviewed with Physician:  Dr. Polly Cobia Ray 12/06/2011 3:05 PM

## 2016-11-06 DIAGNOSIS — F1123 Opioid dependence with withdrawal: Secondary | ICD-10-CM | POA: Diagnosis not present

## 2016-11-06 DIAGNOSIS — F251 Schizoaffective disorder, depressive type: Secondary | ICD-10-CM | POA: Diagnosis not present

## 2016-11-21 DIAGNOSIS — F251 Schizoaffective disorder, depressive type: Secondary | ICD-10-CM | POA: Diagnosis not present

## 2016-12-04 DIAGNOSIS — F251 Schizoaffective disorder, depressive type: Secondary | ICD-10-CM | POA: Diagnosis not present

## 2017-01-05 DIAGNOSIS — F251 Schizoaffective disorder, depressive type: Secondary | ICD-10-CM | POA: Diagnosis not present

## 2017-02-04 DIAGNOSIS — F251 Schizoaffective disorder, depressive type: Secondary | ICD-10-CM | POA: Diagnosis not present

## 2017-02-20 DIAGNOSIS — I10 Essential (primary) hypertension: Secondary | ICD-10-CM | POA: Diagnosis not present

## 2017-02-20 DIAGNOSIS — Z79899 Other long term (current) drug therapy: Secondary | ICD-10-CM | POA: Diagnosis not present

## 2017-03-05 DIAGNOSIS — F251 Schizoaffective disorder, depressive type: Secondary | ICD-10-CM | POA: Diagnosis not present

## 2017-04-02 DIAGNOSIS — F251 Schizoaffective disorder, depressive type: Secondary | ICD-10-CM | POA: Diagnosis not present

## 2017-04-30 DIAGNOSIS — F251 Schizoaffective disorder, depressive type: Secondary | ICD-10-CM | POA: Diagnosis not present

## 2017-05-30 DIAGNOSIS — R05 Cough: Secondary | ICD-10-CM | POA: Diagnosis not present

## 2017-05-30 DIAGNOSIS — J209 Acute bronchitis, unspecified: Secondary | ICD-10-CM | POA: Diagnosis not present

## 2017-05-30 DIAGNOSIS — R0602 Shortness of breath: Secondary | ICD-10-CM | POA: Diagnosis not present

## 2017-06-22 DIAGNOSIS — Z79899 Other long term (current) drug therapy: Secondary | ICD-10-CM | POA: Diagnosis not present

## 2017-06-22 DIAGNOSIS — F251 Schizoaffective disorder, depressive type: Secondary | ICD-10-CM | POA: Diagnosis not present

## 2017-07-21 DIAGNOSIS — F251 Schizoaffective disorder, depressive type: Secondary | ICD-10-CM | POA: Diagnosis not present

## 2017-08-12 DIAGNOSIS — Z79899 Other long term (current) drug therapy: Secondary | ICD-10-CM | POA: Diagnosis not present

## 2017-08-12 DIAGNOSIS — I10 Essential (primary) hypertension: Secondary | ICD-10-CM | POA: Diagnosis not present

## 2017-08-13 DIAGNOSIS — F251 Schizoaffective disorder, depressive type: Secondary | ICD-10-CM | POA: Diagnosis not present

## 2017-08-27 DIAGNOSIS — D508 Other iron deficiency anemias: Secondary | ICD-10-CM | POA: Diagnosis not present

## 2017-08-27 DIAGNOSIS — D649 Anemia, unspecified: Secondary | ICD-10-CM | POA: Diagnosis not present

## 2017-09-08 DIAGNOSIS — F251 Schizoaffective disorder, depressive type: Secondary | ICD-10-CM | POA: Diagnosis not present

## 2017-09-17 DIAGNOSIS — I10 Essential (primary) hypertension: Secondary | ICD-10-CM | POA: Diagnosis not present

## 2017-09-17 DIAGNOSIS — D649 Anemia, unspecified: Secondary | ICD-10-CM | POA: Diagnosis not present

## 2017-09-17 DIAGNOSIS — D126 Benign neoplasm of colon, unspecified: Secondary | ICD-10-CM | POA: Diagnosis not present

## 2017-09-17 DIAGNOSIS — Z87891 Personal history of nicotine dependence: Secondary | ICD-10-CM | POA: Diagnosis not present

## 2017-09-17 DIAGNOSIS — K219 Gastro-esophageal reflux disease without esophagitis: Secondary | ICD-10-CM | POA: Diagnosis not present

## 2017-09-17 DIAGNOSIS — K2950 Unspecified chronic gastritis without bleeding: Secondary | ICD-10-CM | POA: Diagnosis not present

## 2017-09-17 DIAGNOSIS — R5383 Other fatigue: Secondary | ICD-10-CM | POA: Diagnosis not present

## 2017-09-17 DIAGNOSIS — D508 Other iron deficiency anemias: Secondary | ICD-10-CM | POA: Diagnosis not present

## 2017-09-17 DIAGNOSIS — K635 Polyp of colon: Secondary | ICD-10-CM | POA: Diagnosis not present

## 2017-09-17 DIAGNOSIS — F250 Schizoaffective disorder, bipolar type: Secondary | ICD-10-CM | POA: Diagnosis not present

## 2017-09-17 DIAGNOSIS — K317 Polyp of stomach and duodenum: Secondary | ICD-10-CM | POA: Diagnosis not present

## 2017-09-17 DIAGNOSIS — Z79899 Other long term (current) drug therapy: Secondary | ICD-10-CM | POA: Diagnosis not present

## 2017-09-17 DIAGNOSIS — D125 Benign neoplasm of sigmoid colon: Secondary | ICD-10-CM | POA: Diagnosis not present

## 2017-09-19 DIAGNOSIS — J869 Pyothorax without fistula: Secondary | ICD-10-CM

## 2017-09-19 HISTORY — DX: Pyothorax without fistula: J86.9

## 2017-09-25 DIAGNOSIS — R0602 Shortness of breath: Secondary | ICD-10-CM | POA: Diagnosis not present

## 2017-09-25 DIAGNOSIS — A419 Sepsis, unspecified organism: Secondary | ICD-10-CM | POA: Diagnosis not present

## 2017-09-25 DIAGNOSIS — J189 Pneumonia, unspecified organism: Secondary | ICD-10-CM | POA: Diagnosis not present

## 2017-09-25 DIAGNOSIS — J9601 Acute respiratory failure with hypoxia: Secondary | ICD-10-CM | POA: Diagnosis not present

## 2017-09-25 DIAGNOSIS — Z87891 Personal history of nicotine dependence: Secondary | ICD-10-CM

## 2017-09-25 DIAGNOSIS — I10 Essential (primary) hypertension: Secondary | ICD-10-CM | POA: Diagnosis not present

## 2017-09-25 DIAGNOSIS — J90 Pleural effusion, not elsewhere classified: Secondary | ICD-10-CM | POA: Diagnosis not present

## 2017-09-25 DIAGNOSIS — F259 Schizoaffective disorder, unspecified: Secondary | ICD-10-CM | POA: Diagnosis not present

## 2017-09-25 DIAGNOSIS — R6520 Severe sepsis without septic shock: Secondary | ICD-10-CM | POA: Diagnosis not present

## 2017-09-25 DIAGNOSIS — R05 Cough: Secondary | ICD-10-CM | POA: Diagnosis not present

## 2017-09-25 DIAGNOSIS — Z79899 Other long term (current) drug therapy: Secondary | ICD-10-CM | POA: Diagnosis not present

## 2017-09-25 HISTORY — DX: Personal history of nicotine dependence: Z87.891

## 2017-09-26 ENCOUNTER — Inpatient Hospital Stay (HOSPITAL_COMMUNITY): Payer: PPO

## 2017-09-26 ENCOUNTER — Other Ambulatory Visit: Payer: Self-pay

## 2017-09-26 ENCOUNTER — Inpatient Hospital Stay (HOSPITAL_COMMUNITY)
Admission: AD | Admit: 2017-09-26 | Discharge: 2017-10-06 | DRG: 163 | Disposition: A | Discharge status: Home / Self Care | Payer: PPO | Source: Other Acute Inpatient Hospital | Attending: Thoracic Surgery (Cardiothoracic Vascular Surgery) | Admitting: Thoracic Surgery (Cardiothoracic Vascular Surgery)

## 2017-09-26 ENCOUNTER — Encounter (HOSPITAL_COMMUNITY): Payer: Self-pay

## 2017-09-26 DIAGNOSIS — I10 Essential (primary) hypertension: Secondary | ICD-10-CM | POA: Diagnosis present

## 2017-09-26 DIAGNOSIS — Z79899 Other long term (current) drug therapy: Secondary | ICD-10-CM

## 2017-09-26 DIAGNOSIS — Z888 Allergy status to other drugs, medicaments and biological substances status: Secondary | ICD-10-CM

## 2017-09-26 DIAGNOSIS — Z6834 Body mass index (BMI) 34.0-34.9, adult: Secondary | ICD-10-CM | POA: Diagnosis not present

## 2017-09-26 DIAGNOSIS — E669 Obesity, unspecified: Secondary | ICD-10-CM | POA: Diagnosis not present

## 2017-09-26 DIAGNOSIS — J869 Pyothorax without fistula: Secondary | ICD-10-CM | POA: Diagnosis present

## 2017-09-26 DIAGNOSIS — J439 Emphysema, unspecified: Secondary | ICD-10-CM | POA: Diagnosis not present

## 2017-09-26 DIAGNOSIS — R0602 Shortness of breath: Secondary | ICD-10-CM | POA: Diagnosis not present

## 2017-09-26 DIAGNOSIS — D62 Acute posthemorrhagic anemia: Secondary | ICD-10-CM | POA: Diagnosis not present

## 2017-09-26 DIAGNOSIS — Z87891 Personal history of nicotine dependence: Secondary | ICD-10-CM | POA: Diagnosis not present

## 2017-09-26 DIAGNOSIS — K219 Gastro-esophageal reflux disease without esophagitis: Secondary | ICD-10-CM | POA: Diagnosis not present

## 2017-09-26 DIAGNOSIS — J90 Pleural effusion, not elsewhere classified: Secondary | ICD-10-CM | POA: Diagnosis not present

## 2017-09-26 DIAGNOSIS — Z880 Allergy status to penicillin: Secondary | ICD-10-CM

## 2017-09-26 DIAGNOSIS — Z9889 Other specified postprocedural states: Secondary | ICD-10-CM

## 2017-09-26 DIAGNOSIS — R846 Abnormal cytological findings in specimens from respiratory organs and thorax: Secondary | ICD-10-CM | POA: Diagnosis not present

## 2017-09-26 DIAGNOSIS — J9601 Acute respiratory failure with hypoxia: Secondary | ICD-10-CM | POA: Diagnosis not present

## 2017-09-26 DIAGNOSIS — F209 Schizophrenia, unspecified: Secondary | ICD-10-CM | POA: Diagnosis present

## 2017-09-26 DIAGNOSIS — J189 Pneumonia, unspecified organism: Secondary | ICD-10-CM | POA: Diagnosis present

## 2017-09-26 DIAGNOSIS — F319 Bipolar disorder, unspecified: Secondary | ICD-10-CM | POA: Diagnosis not present

## 2017-09-26 DIAGNOSIS — D509 Iron deficiency anemia, unspecified: Secondary | ICD-10-CM | POA: Diagnosis present

## 2017-09-26 DIAGNOSIS — Z4682 Encounter for fitting and adjustment of non-vascular catheter: Secondary | ICD-10-CM | POA: Diagnosis not present

## 2017-09-26 DIAGNOSIS — R05 Cough: Secondary | ICD-10-CM | POA: Diagnosis not present

## 2017-09-26 DIAGNOSIS — E871 Hypo-osmolality and hyponatremia: Secondary | ICD-10-CM | POA: Diagnosis present

## 2017-09-26 DIAGNOSIS — J9811 Atelectasis: Secondary | ICD-10-CM | POA: Diagnosis not present

## 2017-09-26 DIAGNOSIS — J948 Other specified pleural conditions: Secondary | ICD-10-CM | POA: Diagnosis not present

## 2017-09-26 DIAGNOSIS — R079 Chest pain, unspecified: Secondary | ICD-10-CM | POA: Diagnosis not present

## 2017-09-26 DIAGNOSIS — Z882 Allergy status to sulfonamides status: Secondary | ICD-10-CM | POA: Diagnosis not present

## 2017-09-26 DIAGNOSIS — J939 Pneumothorax, unspecified: Secondary | ICD-10-CM | POA: Diagnosis not present

## 2017-09-26 HISTORY — DX: Anxiety disorder, unspecified: F41.9

## 2017-09-26 HISTORY — DX: Schizophrenia, unspecified: F20.9

## 2017-09-26 HISTORY — DX: Gastro-esophageal reflux disease without esophagitis: K21.9

## 2017-09-26 HISTORY — DX: Pneumonia, unspecified organism: J18.9

## 2017-09-26 HISTORY — DX: Bipolar disorder, unspecified: F31.9

## 2017-09-26 HISTORY — DX: Depression, unspecified: F32.A

## 2017-09-26 HISTORY — DX: Major depressive disorder, single episode, unspecified: F32.9

## 2017-09-26 HISTORY — DX: Personal history of nicotine dependence: Z87.891

## 2017-09-26 HISTORY — DX: Essential (primary) hypertension: I10

## 2017-09-26 HISTORY — DX: Anemia, unspecified: D64.9

## 2017-09-26 LAB — BASIC METABOLIC PANEL
ANION GAP: 11 (ref 5–15)
Anion gap: 10 (ref 5–15)
BUN: 9 mg/dL (ref 6–20)
BUN: 9 mg/dL (ref 6–20)
CHLORIDE: 98 mmol/L — AB (ref 101–111)
CO2: 27 mmol/L (ref 22–32)
CO2: 26 mmol/L (ref 22–32)
CREATININE: 0.86 mg/dL (ref 0.61–1.24)
Calcium: 8.1 mg/dL — ABNORMAL LOW (ref 8.9–10.3)
Calcium: 8.1 mg/dL — ABNORMAL LOW (ref 8.9–10.3)
Chloride: 97 mmol/L — ABNORMAL LOW (ref 101–111)
Creatinine, Ser: 0.81 mg/dL (ref 0.61–1.24)
GFR calc non Af Amer: >60 mL/min (ref >60)
GFR calc non Af Amer: >60 mL/min (ref >60)
Glucose, Bld: 148 mg/dL — ABNORMAL HIGH (ref 65–99)
Glucose, Bld: 120 mg/dL — ABNORMAL HIGH (ref 65–99)
POTASSIUM: 3.3 mmol/L — AB (ref 3.5–5.1)
Potassium: 4 mmol/L (ref 3.5–5.1)
SODIUM: 135 mmol/L (ref 135–145)
Sodium: 134 mmol/L — ABNORMAL LOW (ref 135–145)

## 2017-09-26 LAB — HEPATIC FUNCTION PANEL
ALBUMIN: 2.2 g/dL — AB (ref 3.5–5.0)
ALT: 23 U/L (ref 17–63)
AST: 18 U/L (ref 15–41)
Alkaline Phosphatase: 55 U/L (ref 38–126)
BILIRUBIN INDIRECT: 0.5 mg/dL (ref 0.3–0.9)
Bilirubin, Direct: 0.1 mg/dL (ref 0.1–0.5)
Total Bilirubin: 0.6 mg/dL (ref 0.3–1.2)
Total Protein: 6.6 g/dL (ref 6.5–8.1)

## 2017-09-26 LAB — GLUCOSE, CAPILLARY
GLUCOSE-CAPILLARY: 115 mg/dL — AB (ref 65–99)
Glucose-Capillary: 113 mg/dL — ABNORMAL HIGH (ref 65–99)
Glucose-Capillary: 129 mg/dL — ABNORMAL HIGH (ref 65–99)

## 2017-09-26 LAB — CBC WITH DIFFERENTIAL/PLATELET
BASOS ABS: 0 10*3/uL (ref 0.0–0.1)
Basophils Relative: 0 %
Eosinophils Absolute: 0.3 10*3/uL (ref 0.0–0.7)
Eosinophils Relative: 2 %
HEMATOCRIT: 32.9 % — AB (ref 39.0–52.0)
HEMOGLOBIN: 9.4 g/dL — AB (ref 13.0–17.0)
Lymphocytes Relative: 7 %
Lymphs Abs: 1 10*3/uL (ref 0.7–4.0)
MCH: 22.4 pg — ABNORMAL LOW (ref 26.0–34.0)
MCHC: 28.6 g/dL — ABNORMAL LOW (ref 30.0–36.0)
MCV: 78.5 fL (ref 78.0–100.0)
MONOS PCT: 10 %
Monocytes Absolute: 1.5 10*3/uL — ABNORMAL HIGH (ref 0.1–1.0)
Neutro Abs: 11.7 10*3/uL — ABNORMAL HIGH (ref 1.7–7.7)
Neutrophils Relative %: 81 %
Platelets: 358 10*3/uL (ref 150–400)
RBC: 4.19 MIL/uL — AB (ref 4.22–5.81)
RDW: 21.9 % — ABNORMAL HIGH (ref 11.5–15.5)
WBC: 14.5 10*3/uL — AB (ref 4.0–10.5)

## 2017-09-26 LAB — PROTEIN, PLEURAL OR PERITONEAL FLUID: Total protein, fluid: 4.2 g/dL

## 2017-09-26 LAB — BODY FLUID CELL COUNT WITH DIFFERENTIAL
Eos, Fluid: 0 %
LYMPHS FL: 0 %
Monocyte-Macrophage-Serous Fluid: 6 % — ABNORMAL LOW (ref 50–90)
NEUTROPHIL FLUID: 94 % — AB (ref 0–25)
Other Cells, Fluid: 0 %
WBC FLUID: 7868 uL — AB (ref 0–1000)

## 2017-09-26 LAB — SEDIMENTATION RATE: Sed Rate: 104 mm/hr — ABNORMAL HIGH (ref 0–16)

## 2017-09-26 LAB — LACTATE DEHYDROGENASE, PLEURAL OR PERITONEAL FLUID: LD FL: 342 U/L — AB (ref 3–23)

## 2017-09-26 LAB — BRAIN NATRIURETIC PEPTIDE: B Natriuretic Peptide: 85.1 pg/mL (ref 0.0–100.0)

## 2017-09-26 LAB — AMYLASE, PLEURAL OR PERITONEAL FLUID: AMYLASE FL: 19 U/L

## 2017-09-26 LAB — LACTATE DEHYDROGENASE: LDH: 248 U/L — ABNORMAL HIGH (ref 98–192)

## 2017-09-26 LAB — STREP PNEUMONIAE URINARY ANTIGEN: Strep Pneumo Urinary Antigen: NEGATIVE

## 2017-09-26 LAB — PROTEIN, TOTAL: Total Protein: 6.2 g/dL — ABNORMAL LOW (ref 6.5–8.1)

## 2017-09-26 LAB — GRAM STAIN

## 2017-09-26 MED ORDER — RISPERIDONE 1 MG PO TABS: 0.5000 mg | ORAL_TABLET | Freq: Two times a day (BID) | ORAL | Status: DC

## 2017-09-26 MED ORDER — ONDANSETRON HCL 4 MG/2ML IJ SOLN: 4.0000 mg | Freq: Four times a day (QID) | INTRAMUSCULAR | Status: DC | PRN

## 2017-09-26 MED ORDER — ONDANSETRON HCL 4 MG PO TABS: 4.0000 mg | ORAL_TABLET | Freq: Four times a day (QID) | ORAL | Status: DC | PRN

## 2017-09-26 MED ORDER — OLANZAPINE 7.5 MG PO TABS
15.0000 mg | ORAL_TABLET | Freq: Every day | ORAL | Status: DC
  Administered 2017-09-26 – 2017-10-05 (×10): 15 mg via ORAL
  Filled 2017-09-26 (×11): qty 2

## 2017-09-26 MED ORDER — GABAPENTIN 300 MG PO CAPS
300.0000 mg | ORAL_CAPSULE | Freq: Three times a day (TID) | ORAL | Status: DC
  Administered 2017-09-26 – 2017-10-06 (×27): 300 mg via ORAL
  Filled 2017-09-26 (×27): qty 1

## 2017-09-26 MED ORDER — LEVOFLOXACIN IN D5W 750 MG/150ML IV SOLN: 750.0000 mg | INTRAVENOUS | Status: DC

## 2017-09-26 MED ORDER — QUETIAPINE FUMARATE 50 MG PO TABS
50.0000 mg | ORAL_TABLET | Freq: Every day | ORAL | Status: DC
  Administered 2017-09-26 – 2017-10-05 (×10): 50 mg via ORAL
  Filled 2017-09-26 (×2): qty 1
  Filled 2017-09-26 (×2): qty 2
  Filled 2017-09-26: qty 1
  Filled 2017-09-26: qty 2
  Filled 2017-09-26 (×2): qty 1
  Filled 2017-09-26: qty 2
  Filled 2017-09-26: qty 1

## 2017-09-26 MED ORDER — SERTRALINE HCL 50 MG PO TABS: 25.0000 mg | ORAL_TABLET | Freq: Every day | ORAL | Status: DC

## 2017-09-26 MED ORDER — LEVOFLOXACIN IN D5W 750 MG/150ML IV SOLN
750.0000 mg | INTRAVENOUS | Status: DC
  Administered 2017-09-26 – 2017-09-29 (×3): 750 mg via INTRAVENOUS
  Filled 2017-09-26 (×3): qty 150

## 2017-09-26 MED ORDER — ACETAMINOPHEN 650 MG RE SUPP: 650.0000 mg | Freq: Four times a day (QID) | RECTAL | Status: DC | PRN

## 2017-09-26 MED ORDER — FERROUS SULFATE 325 (65 FE) MG PO TABS
325.0000 mg | ORAL_TABLET | Freq: Two times a day (BID) | ORAL | Status: DC
  Administered 2017-09-26 – 2017-10-06 (×19): 325 mg via ORAL
  Filled 2017-09-26 (×19): qty 1

## 2017-09-26 MED ORDER — AMLODIPINE BESYLATE 10 MG PO TABS: 10.0000 mg | ORAL_TABLET | Freq: Every day | ORAL | Status: DC

## 2017-09-26 MED ORDER — VANCOMYCIN HCL 10 G IV SOLR
1500.0000 mg | Freq: Once | INTRAVENOUS | Status: DC
  Filled 2017-09-26: qty 1500

## 2017-09-26 MED ORDER — AMLODIPINE BESYLATE 10 MG PO TABS
10.0000 mg | ORAL_TABLET | Freq: Every day | ORAL | Status: DC
  Administered 2017-09-26 – 2017-09-28 (×3): 10 mg via ORAL
  Filled 2017-09-26 (×3): qty 1

## 2017-09-26 MED ORDER — VANCOMYCIN HCL IN DEXTROSE 1-5 GM/200ML-% IV SOLN
1000.0000 mg | Freq: Three times a day (TID) | INTRAVENOUS | Status: DC
  Administered 2017-09-26 – 2017-09-27 (×4): 1000 mg via INTRAVENOUS
  Filled 2017-09-26 (×4): qty 200

## 2017-09-26 MED ORDER — ACETAMINOPHEN 325 MG PO TABS
650.0000 mg | ORAL_TABLET | Freq: Four times a day (QID) | ORAL | Status: DC | PRN
  Administered 2017-09-26 – 2017-09-29 (×8): 650 mg via ORAL
  Filled 2017-09-26 (×8): qty 2

## 2017-09-26 NOTE — Progress Notes (Addendum)
Assessed patient per Charge RN request. Reviewed chart and notes. CXR results and image shows left sided large effusion, whited out left lung. Assessed breath sounds, clear on right, diminished left side, but still moving air. Absent left base noted. Patient slightly tachypneic with mild shortness of breath. Patient NPO at this time for possible tests/procedures. Bedside RN notified to continue to monitor for changes and call prn.

## 2017-09-26 NOTE — Progress Notes (Addendum)
[facility]                                                                              Patient Demographics  Jeremy Dillon, is a 47 y.o. male, DOB - 1970-06-25, TKW:[id number]  Admit date - 09/26/2017   Admitting Physician Rise Patience, MD  Outpatient Primary MD for the patient is Patient, No Pcp Per  Outpatient specialists:   LOS - 0  days   Medical records reviewed and are as summarized below:    No chief complaint on file.      Brief summary   Patient is a 47 year old male with hypertension, schizophrenia, iron deficiency anemia presented to [facility] ED with left-sided chest pain and shortness of breath for last 2 days.  No fevers or chills.  In ED, patient was found to be tachycardia, dyspnea, CT scan of the chest showed very large left pleural effusion with possible loculation and collapse of much of the left lung, concerning for any underlying malignancy.  Patient was started on IV vancomycin and Levaquin and transferred to [facility] for further work-up.   Assessment & Plan    Principal Problem:   Acute respiratory failure with hypoxia (HCC) likely due to large left-sided pleural effusion with whiteout/collapse, community-acquired pneumonia - Follow blood cultures - Per admitting physician, PCCM has been consulted, will follow recommendations if patient will need thoracentesis versus VATS. Continue NPO   -Follow urine strep antigen, urine Legionella antigen -Will place on vancomycin and Zosyn to cover for possible empyema  Active Problems:    Hypertension Currently stable, continue Norvasc     Microcytic hypochromic anemia -Recent EGD and colonoscopy and per patient were unremarkable, continue iron supplements  History of schizophrenia -Restart Risperdal, Zoloft  Code Status: Full code DVT Prophylaxis:  SCD's Family Communication: Discussed in detail with the patient, all imaging results, lab results explained to  the patient and father at the bedside   Disposition Plan:   Time Spent in minutes   35 minutes  Procedures:  CT chest  Consultants:   Pulmonary critical care  Antimicrobials:   IV Levaquin 6/8     Medications  Scheduled Meds: . amLODipine  10 mg Oral Daily   Continuous Infusions: . [START ON 09/27/2017] levofloxacin (LEVAQUIN) IV     PRN Meds:.acetaminophen **OR** acetaminophen, ondansetron **OR** ondansetron (ZOFRAN) IV   Antibiotics   Anti-infectives (From admission, onward)   Start     Dose/Rate Route Frequency Ordered Stop   09/27/17 0600  levofloxacin (LEVAQUIN) IVPB 750 mg     750 mg 100 mL/hr over 90 Minutes Intravenous Every 24 hours 09/26/17 0823 10/03/17 0559   09/26/17 0815  vancomycin (VANCOCIN) 1,500 mg in sodium chloride 0.9 % 500 mL IVPB  Status:  Discontinued     1,500 mg 250 mL/hr over 120 Minutes Intravenous  Once 09/26/17 0808 09/26/17 0824   09/26/17 0730  levofloxacin (LEVAQUIN) IVPB 750 mg  Status:  Discontinued     750 mg 100 mL/hr over 90 Minutes Intravenous Every 24 hours 09/26/17 0720 09/26/17 9242        Subjective:   Jeremy Dillon  was seen and examined today.  No acute complaints, feeling slightly better this morning, no chest pain or acute shortness of breath at this time. Patient denies dizziness, abdominal pain, N/V/D/C, new weakness, numbess, tingling. No acute events overnight.    Objective:   Vitals:   09/26/17 0539 09/26/17 0544 09/26/17 0637 09/26/17 0949  BP:  127/85  129/83  Pulse:  (!) 126  (!) 129  Resp:  (!) 22  20  Temp:  98.7 F (37.1 C)  98.8 F (37.1 C)  TempSrc:  Oral  Oral  SpO2:  96%  93%  Weight: 96.2 kg (212 lb)     Height:   5\' 9"  (1.753 m)     Intake/Output Summary (Last 24 hours) at 09/26/2017 1016 Last data filed at 09/26/2017 0900 Gross per 24 hour  Intake 0 ml  Output 400 ml  Net -400 ml     Wt Readings from Last 3 Encounters:  09/26/17 96.2 kg (212 lb)     Exam  General: Alert and  oriented x 3, NAD  Eyes:  HEENT:  Atraumatic, normocephalic  Cardiovascular: S1 S2 auscultated, no rubs, murmurs or gallops. Regular rate and rhythm.  Respiratory: dec BS Left   Gastrointestinal: Soft, nontender, nondistended, + bowel sounds  Ext: no pedal edema bilaterally  Neuro: no new deficit  Musculoskeletal: No digital cyanosis, clubbing  Skin: No rashes  Psych: Normal affect and demeanor, alert and oriented x3    Data Reviewed:  I have personally reviewed following labs and imaging studies  Micro Results No results found for this or any previous visit (from the past 240 hour(s)).  Radiology Reports Dg Chest Port 1 View  Result Date: 09/26/2017 CLINICAL DATA:  Acute onset of shortness of breath. EXAM: PORTABLE CHEST 1 VIEW COMPARISON:  Chest radiograph and CT of the chest performed earlier today at 12:07 a.m. and 12:33 a.m. FINDINGS: A very large left-sided pleural effusion is again noted, with underlying airspace opacification. Underlying vascular congestion is noted. No pneumothorax is seen. The cardiomediastinal silhouette is not well characterized due to the adjacent pleural effusion. No acute osseous abnormalities are identified. IMPRESSION: Very large left-sided pleural effusion again noted, with underlying airspace opacification. Vascular congestion seen. Electronically Signed   By: Garald Balding M.D.   On: 09/26/2017 06:11    Lab Data:  CBC: Recent Labs  Lab 09/26/17 0633  WBC 14.5*  NEUTROABS 11.7*  HGB 9.4*  HCT 32.9*  MCV 78.5  PLT 378   Basic Metabolic Panel: Recent Labs  Lab 09/26/17 0633 09/26/17 0831  NA 134* 135  K 3.3* 4.0  CL 97* 98*  CO2 27 26  GLUCOSE 148* 120*  BUN 9 9  CREATININE 0.86 0.81  CALCIUM 8.1* 8.1*   GFR: Estimated Creatinine Clearance: 133.2 mL/min (by C-G formula based on SCr of 0.81 mg/dL). Liver Function Tests: Recent Labs  Lab 09/26/17 0633  AST 18  ALT 23  ALKPHOS 55  BILITOT 0.6  PROT 6.6  ALBUMIN  2.2*   No results for input(s): LIPASE, AMYLASE in the last 168 hours. No results for input(s): AMMONIA in the last 168 hours. Coagulation Profile: No results for input(s): INR, PROTIME in the last 168 hours. Cardiac Enzymes: No results for input(s): CKTOTAL, CKMB, CKMBINDEX, TROPONINI in the last 168 hours. BNP (last 3 results) No results for input(s): PROBNP in the last 8760 hours. HbA1C: No results for input(s): HGBA1C in the last 72 hours. CBG: Recent Labs  Lab 09/26/17 [phone]  GLUCAP 115*   Lipid Profile: No results for input(s): CHOL, HDL, LDLCALC, TRIG, CHOLHDL, LDLDIRECT in the last 72 hours. Thyroid Function Tests: No results for input(s): TSH, T4TOTAL, FREET4, T3FREE, THYROIDAB in the last 72 hours. Anemia Panel: No results for input(s): VITAMINB12, FOLATE, FERRITIN, TIBC, IRON, RETICCTPCT in the last 72 hours. Urine analysis:    Component Value Date/Time   COLORURINE YELLOW 12/04/2011 0315   APPEARANCEUR CLEAR 12/04/2011 0315   LABSPEC 1.011 12/04/2011 0315   PHURINE 6.0 12/04/2011 0315   GLUCOSEU NEGATIVE 12/04/2011 0315   HGBUR NEGATIVE 12/04/2011 0315   BILIRUBINUR NEGATIVE 12/04/2011 0315   KETONESUR NEGATIVE 12/04/2011 0315   PROTEINUR NEGATIVE 12/04/2011 0315   UROBILINOGEN 0.2 12/04/2011 0315   NITRITE NEGATIVE 12/04/2011 0315   LEUKOCYTESUR NEGATIVE 12/04/2011 0315     Ripudeep Rai M.D. [facility] 09/26/2017, 10:16 AM  Pager: [phone] Between 7am to 7pm - call Pager - [phone]  After 7pm go to [email or web address] - password [email or web address]  Call night coverage person covering after 7pm

## 2017-09-26 NOTE — Consult Note (Signed)
Name: Jeremy Dillon MRN: [id number] DOB: 11/17/1970    ADMISSION DATE:  09/26/2017 CONSULTATION DATE: 09/26/2017  REFERRING MD : Primary  CHIEF COMPLAINT: Dyspnea  BRIEF PATIENT DESCRIPTION: Well-developed male no acute distress  SIGNIFICANT EVENTS  09/26/2017  STUDIES:  CT scan with large left effusion   HISTORY OF PRESENT ILLNESS:   47 year old male placed approximately 3 times a week, is known to suggest active disorder as well controlled with medications, history of pneumonia as documented by radiographic imaging in February 2019.  At the time he had pneumonia in February 2019 primary symptom was dyspnea.  He was treated with Tamiflu currently on therapy improved and was discharged home  [facility] with increasing dyspnea on exertion chest x-ray CT scan showed a large left pleural effusion with some loculation.  And asked to evaluate.  We did some ultrasound demonstration pictures as noted above.  Does have a large loculated effusion but there is fluid that is mobile inside relations.  We could easily thoracentesis and probably pull greater than thousand cc. This accomplished to goals 1 to decrease decrease his dyspnea and to exclude for diagnosis.  Following drainage of effusion.  Time to reimage him a CT scan to look for malignancy.  He has agreed to thoracentesis and that will be performed today 09/21/2017  PAST MEDICAL HISTORY :   has a past medical history of Anemia, Anxiety, Bipolar disorder ([place]), Depression, GERD (gastroesophageal reflux disease), Hypertension, Pneumonia, Schizophrenia ([place]), and Smoking history (09/25/2017).  has a past surgical history that includes shoulder surgery. Prior to Admission medications   Medication Sig Start Date End Date Taking? Authorizing Provider  amLODipine (NORVASC) 10 MG tablet Take 10 mg by mouth daily. 09/07/17  Yes [provider]  ferrous sulfate 325 (65 FE) MG tablet Take 325 mg by mouth 2 (two) times daily. 08/27/17   Yes [provider]  gabapentin (NEURONTIN) 300 MG capsule Take 300 mg by mouth 3 (three) times daily. 08/30/17  Yes [provider]  ibuprofen (ADVIL,MOTRIN) 200 MG tablet Take 200 mg by mouth every 6 (six) hours as needed. Shoulder pain   Yes [provider]  OLANZapine (ZYPREXA) 15 MG tablet Take 15 mg by mouth at bedtime. 09/13/17  Yes [provider]  QUEtiapine (SEROQUEL) 50 MG tablet Take 50-100 mg by mouth at bedtime. 09/13/17  Yes [provider]  venlafaxine XR (EFFEXOR-XR) 75 MG 24 hr capsule Take 225 mg by mouth daily. 08/28/17  Yes [provider]   Allergies  Allergen Reactions  . Erythromycin Swelling    Cannot take any mycins  . Penicillins Swelling    Cannot take any cillins Has patient had a PCN reaction causing immediate rash, facial/tongue/throat swelling, SOB or lightheadedness with hypotension: yes Has patient had a PCN reaction causing severe rash involving mucus membranes or skin necrosis: unk Has patient had a PCN reaction that required hospitalization: unk Has patient had a PCN reaction occurring within the last 10 years: no If all of the above answers are "NO", then may proceed with Cephalosporin use.   . Sulfa Antibiotics     Cannot take any sulfa drugs    FAMILY HISTORY:  family history includes Breast cancer in his paternal grandmother; Diabetes Mellitus II in his brother; Prostate cancer in his paternal grandfather. SOCIAL HISTORY:  reports that he has quit smoking. He uses smokeless tobacco. He reports that he drinks alcohol. He reports that he has current or past drug history. Drug: Marijuana.  REVIEW OF SYSTEMS:   10 point review of system taken, please see HPI for positives and negatives.   SUBJECTIVE:  No acute distress at rest but desaturates with any activity VITAL SIGNS: Temp:  [98.7 F (37.1 C)-98.8 F (37.1 C)] 98.8 F (37.1 C) (06/08 0949) Pulse Rate:  [126-129] 129 (06/08 0949) Resp:   [20-22] 20 (06/08 0949) BP: (127-129)/(83-85) 129/83 (06/08 0949) SpO2:  [93 %-96 %] 93 % (06/08 0949) FiO2 (%):  [2 %] 2 % (06/08 0949) Weight:  [96.2 kg (212 lb)] 96.2 kg (212 lb) (06/08 0539)  PHYSICAL EXAMINATION: General: Male no acute distress Neuro: Strange otherwise intact HEENT: No JVD or lymphadenopathy is appreciated Cardiovascular: Sounds regular regular rate and rhythm Lungs: Decreased breath sounds on the left, dull to percussion Abdomen: Soft nontender Musculoskeletal: Intact Skin: Multiple body  Recent Labs  Lab 09/26/17 0633 09/26/17 0831  NA 134* 135  K 3.3* 4.0  CL 97* 98*  CO2 27 26  BUN 9 9  CREATININE 0.86 0.81  GLUCOSE 148* 120*   Recent Labs  Lab 09/26/17 0633  HGB 9.4*  HCT 32.9*  WBC 14.5*  PLT 358   Dg Chest Port 1 View  Result Date: 09/26/2017 CLINICAL DATA:  Acute onset of shortness of breath. EXAM: PORTABLE CHEST 1 VIEW COMPARISON:  Chest radiograph and CT of the chest performed earlier today at 12:07 a.m. and 12:33 a.m. FINDINGS: A very large left-sided pleural effusion is again noted, with underlying airspace opacification. Underlying vascular congestion is noted. No pneumothorax is seen. The cardiomediastinal silhouette is not well characterized due to the adjacent pleural effusion. No acute osseous abnormalities are identified. IMPRESSION: Very large left-sided pleural effusion again noted, with underlying airspace opacification. Vascular congestion seen. Electronically Signed   By: Garald Balding M.D.   On: 09/26/2017 06:11        ASSESSMENT: Principal Problem:   Acute respiratory failure with hypoxia (HCC) Active Problems:   Pleural effusion, left   Hx of pna 2/19   Hypertension   Microcytic hypochromic anemia   Vapes   Dyspnea recurrent  Schizoaffective do   Discussion: 47 year old male placed approximately 3 times a week, is known to suggest active disorder as well controlled with medications, history of pneumonia as  documented by radiographic imaging in February 2019.  At the time he had pneumonia in February 2019 primary symptom was dyspnea.  He was treated with Tamiflu currently on therapy improved and was discharged home  [facility] with increasing dyspnea on exertion chest x-ray CT scan showed a large left pleural effusion with some loculation.  And asked to evaluate.  We did some ultrasound demonstration pictures as noted above.  Does have a large loculated effusion but there is fluid that is mobile inside relations.  We could easily thoracentesis and probably pull greater than thousand cc. This accomplished to goals 1 to decrease decrease his dyspnea and to exclude for diagnosis.  Following drainage of effusion.  Time to reimage him a CT scan to look for malignancy.  He has agreed to thoracentesis and that will be performed today 09/21/2017     PLAN: 1 thoracentesis fluid sent to laboratory for diagnosis and drain fluid to decrease his dyspnea.  2 repeat CT scan to evaluate for any malignancies correlate with fluid  cytology and culture data.  3 suggested he stop vaping  4 agree with antibiotic therapy  5 other issues per [facility]   Richardson Landry Aviella Disbrow ACNP [facility] PCCM Pager [phone] till  1 pm If no answer page [phone] 09/26/2017, 3:02 PM

## 2017-09-26 NOTE — Progress Notes (Signed)
MD was paged twice by Charge Regino Schultze to alert pt has arrived.

## 2017-09-26 NOTE — Progress Notes (Signed)
ANTIBIOTIC CONSULT NOTE - INITIAL  Pharmacy Consult for vancomycin and zosyn  Indication: pneumonia  Allergies  Allergen Reactions  . Erythromycin     Cannot take any mycins  . Penicillins     Cannot take any cillins  . Sulfa Antibiotics     Cannot take any sulfa drugs    Patient Measurements: Height: 5\' 9"  (175.3 cm) Weight: 212 lb (96.2 kg) IBW/kg (Calculated) : 70.7   Vital Signs: Temp: 98.7 F (37.1 C) (06/08 0544) Temp Source: Oral (06/08 0544) BP: 127/85 (06/08 0544) Pulse Rate: 126 (06/08 0544) Intake/Output from previous day: 06/07 0701 - 06/08 0700 In: -  Out: 400 [Urine:400] Intake/Output from this shift: No intake/output data recorded.  Labs: No results for input(s): WBC, HGB, PLT, LABCREA, CREATININE in the last 72 hours. CrCl cannot be calculated (Patient's most recent lab result is older than the maximum 21 days allowed.). No results for input(s): VANCOTROUGH, VANCOPEAK, VANCORANDOM, GENTTROUGH, GENTPEAK, GENTRANDOM, TOBRATROUGH, TOBRAPEAK, TOBRARND, AMIKACINPEAK, AMIKACINTROU, AMIKACIN in the last 72 hours.   Microbiology: No results found for this or any previous visit (from the past 720 hour(s)).  Medical History: Past Medical History:  Diagnosis Date  . Anemia   . Anxiety   . Bipolar disorder ([place])   . Depression   . GERD (gastroesophageal reflux disease)   . Hypertension   . Pneumonia   . Schizophrenia ([place])   . Smoking history 09/25/2017   vapes    Medications:  Scheduled:  . amLODipine  10 mg Oral Daily   Assessment: Jeremy Dillon is a 47 y.o. male transferring from [place] to [facility] on 6/8 for concern of pneumonia following a 2 day course of shortness of breath and a nonproductive cough. PTA no fever reported by patient. CAT scan completed at outside facility shows large pleural effusion on left side and concern for malignancy vs empyema. Patient may need thoracentesis or VAT on this admission. WBC elevated at 14.5 (reported to me  from [place] to be 17). Patient is currently afebrile.   Of note patient received the following medications before transfer to [facility]:  CTX x 1 at 0045 Vanc 2g IV x 1 at 0400 Levaquin x 1 at 0100  Patient also had blood and urine cultures performed at outside facility before transfer.   Goal of Therapy:  Vancomycin trough level 15-20 mcg/ml  Plan:  Vancomycin 2g load given at outside facility at 0400 Continue vancomycin 1g IV q8h  Levaquin 750mg  q24h (received dose before admission on 6/8) Expected duration 7 days with resolution of temperature and/or normalization of WBC  Jalene Mullet, Pharm.D. PGY1 Pharmacy Resident 09/26/2017 8:13 AM

## 2017-09-26 NOTE — Progress Notes (Signed)
Bandaid to left back clean, dry, and intact

## 2017-09-26 NOTE — Procedures (Signed)
Thoracentesis Procedure Note  Pre-operative Diagnosis: Loculated left effusion  Post-operative Diagnosis: normal  Indications: Left pleural effusion to be tapped  Procedure Details  Consent: Informed consent was obtained. Risks of the procedure were discussed including: infection, bleeding, pain, pneumothorax.  Under sterile conditions the patient was positioned. Betadine solution and sterile drapes were utilized.  1% buffered lidocaine was used to anesthetize the 5 rib space. Fluid was obtained without any difficulties and minimal blood loss.  A dressing was applied to the wound and wound care instructions were provided.   Findings 1400 ml of cloudy pleural fluid was obtained. A sample was sent to Pathology for, cytology and cell counts, as well as for infection analysis.  Complications:  None; patient tolerated the procedure well.          Condition: stable  Plan A follow up chest x-ray was ordered. Bed Rest for 0 hours. Tylenol 650 mg. for pain.  Richardson Landry Felicie Kocher ACNP [facility] PCCM Pager [phone] till 1 pm If no answer page [phone] 09/26/2017, 3:08 PM

## 2017-09-26 NOTE — H&P (Signed)
History and Physical    Jeremy Dillon QPY:[id number] DOB: 03-Jun-1970 DOA: 09/26/2017  PCP: Patient, No Pcp Per  Patient coming from: Patient was transferred from [facility].  Chief Complaint: Left-sided chest pain and shortness of breath.  HPI: Jeremy Dillon is a 47 y.o. male with history of hypertension, schizophrenia iron deficiency anemia had gone to the ER at [facility] because of left-sided chest pain and shortness of breath.  Patient has been having the symptoms for last 2 days.  Has been having nonproductive cough.  Denies any fever or chills.  Has had recent EGD and colonoscopy 2 weeks ago for anemia and was found to have iron deficiency anemia.  Scopes were unremarkable as per the patient.  ED Course: In the ER at [facility] patient was found to be tachycardic mildly short of breath and CAT scan of the chest done shows a very large left pleural effusion with with possible loculation and collapse of much of the left lung raising concern for underlying primary bronchogenic malignancy.  Enlarged mediastinal nodes concerning for metastatic disease.  Trace right-sided pleural effusion.  Nonspecific 3 cm hypodensity in the left hepatic lobe.  Patient labs showed hyponatremia and anemia.  EKG shows sinus tachycardia.  Patient was started on vancomycin Levaquin and since patient may need pulmonary or cardiothoracic input patient was transferred to [facility].  On my exam patient is not in distress.  Review of Systems: As per HPI, rest all negative.   Past Medical History:  Diagnosis Date  . Anemia   . Anxiety   . Bipolar disorder ([place])   . Depression   . GERD (gastroesophageal reflux disease)   . Hypertension   . Pneumonia   . Schizophrenia ([place])   . Smoking history 09/25/2017   vapes    Past Surgical History:  Procedure Laterality Date  . shoulder surgery       reports that he has quit smoking. He uses smokeless tobacco. He reports that he drinks  alcohol. He reports that he has current or past drug history. Drug: Marijuana.  Allergies  Allergen Reactions  . Erythromycin     Cannot take any mycins  . Penicillins     Cannot take any cillins  . Sulfa Antibiotics     Cannot take any sulfa drugs    Family History  Problem Relation Age of Onset  . Diabetes Mellitus II Brother   . Breast cancer Paternal Grandmother   . Prostate cancer Paternal Grandfather     Prior to Admission medications   Medication Sig Start Date End Date Taking? Authorizing Provider  ibuprofen (ADVIL,MOTRIN) 200 MG tablet Take 200 mg by mouth every 6 (six) hours as needed. Shoulder pain    [provider]  risperiDONE (RISPERDAL) 0.5 MG tablet Take 1 tablet (0.5 mg total) by mouth 2 (two) times daily. 12/06/11 01/05/12  Lacretia Leigh, MD  sertraline (ZOLOFT) 25 MG tablet Take 1 tablet (25 mg total) by mouth daily. 12/06/11 12/05/12  Lacretia Leigh, MD    Physical Exam: Vitals:   09/26/17 0539 09/26/17 0544 09/26/17 0637  BP:  127/85   Pulse:  (!) 126   Resp:  (!) 22   Temp:  98.7 F (37.1 C)   TempSrc:  Oral   SpO2:  96%   Weight: 96.2 kg (212 lb)    Height:   5\' 9"  (1.753 m)      Constitutional: Moderately built and nourished. Vitals:   09/26/17 1245 09/26/17 0544  09/26/17 0637  BP:  127/85   Pulse:  (!) 126   Resp:  (!) 22   Temp:  98.7 F (37.1 C)   TempSrc:  Oral   SpO2:  96%   Weight: 96.2 kg (212 lb)    Height:   5\' 9"  (1.753 m)   Eyes: Anicteric no pallor. ENMT: No discharge from the ears eyes nose or mouth. Neck: No mass palpated no JVD appreciated. Respiratory: Decreased air entry on the left side. Cardiovascular: S1-S2 heard no murmurs appreciated. Abdomen: Soft nontender bowel sounds present. Musculoskeletal: No edema. Skin: No rash. Neurologic: Alert awake oriented to time place and person.  Moves all extremities. Psychiatric: Appears normal per normal affect.   Labs on Admission: I have personally reviewed  following labs and imaging studies  CBC: No results for input(s): WBC, NEUTROABS, HGB, HCT, MCV, PLT in the last 168 hours. Basic Metabolic Panel: No results for input(s): NA, K, CL, CO2, GLUCOSE, BUN, CREATININE, CALCIUM, MG, PHOS in the last 168 hours. GFR: CrCl cannot be calculated (Patient's most recent lab result is older than the maximum 21 days allowed.). Liver Function Tests: No results for input(s): AST, ALT, ALKPHOS, BILITOT, PROT, ALBUMIN in the last 168 hours. No results for input(s): LIPASE, AMYLASE in the last 168 hours. No results for input(s): AMMONIA in the last 168 hours. Coagulation Profile: No results for input(s): INR, PROTIME in the last 168 hours. Cardiac Enzymes: No results for input(s): CKTOTAL, CKMB, CKMBINDEX, TROPONINI in the last 168 hours. BNP (last 3 results) No results for input(s): PROBNP in the last 8760 hours. HbA1C: No results for input(s): HGBA1C in the last 72 hours. CBG: No results for input(s): GLUCAP in the last 168 hours. Lipid Profile: No results for input(s): CHOL, HDL, LDLCALC, TRIG, CHOLHDL, LDLDIRECT in the last 72 hours. Thyroid Function Tests: No results for input(s): TSH, T4TOTAL, FREET4, T3FREE, THYROIDAB in the last 72 hours. Anemia Panel: No results for input(s): VITAMINB12, FOLATE, FERRITIN, TIBC, IRON, RETICCTPCT in the last 72 hours. Urine analysis:    Component Value Date/Time   COLORURINE YELLOW 12/04/2011 0315   APPEARANCEUR CLEAR 12/04/2011 0315   LABSPEC 1.011 12/04/2011 0315   PHURINE 6.0 12/04/2011 0315   GLUCOSEU NEGATIVE 12/04/2011 0315   HGBUR NEGATIVE 12/04/2011 0315   BILIRUBINUR NEGATIVE 12/04/2011 0315   KETONESUR NEGATIVE 12/04/2011 0315   PROTEINUR NEGATIVE 12/04/2011 0315   UROBILINOGEN 0.2 12/04/2011 0315   NITRITE NEGATIVE 12/04/2011 0315   LEUKOCYTESUR NEGATIVE 12/04/2011 0315   Sepsis Labs: @LABRCNTIP (procalcitonin:4,lacticidven:4) )No results found for this or any previous visit (from the past  240 hour(s)).   Radiological Exams on Admission: Dg Chest Port 1 View  Result Date: 09/26/2017 CLINICAL DATA:  Acute onset of shortness of breath. EXAM: PORTABLE CHEST 1 VIEW COMPARISON:  Chest radiograph and CT of the chest performed earlier today at 12:07 a.m. and 12:33 a.m. FINDINGS: A very large left-sided pleural effusion is again noted, with underlying airspace opacification. Underlying vascular congestion is noted. No pneumothorax is seen. The cardiomediastinal silhouette is not well characterized due to the adjacent pleural effusion. No acute osseous abnormalities are identified. IMPRESSION: Very large left-sided pleural effusion again noted, with underlying airspace opacification. Vascular congestion seen. Electronically Signed   By: Garald Balding M.D.   On: 09/26/2017 06:11    EKG: Independently reviewed.  Sinus tachycardia EKG done at [facility].  Assessment/Plan Principal Problem:   Acute respiratory failure with hypoxia [facility]) Active Problems:   Pleural effusion, left  Hypertension   Microcytic hypochromic anemia    1. Acute respiratory failure with hypoxia likely secondary to large left pleural effusion -blood cultures and repeat CBC has been ordered.  Patient is empirically placed on vancomycin and Levaquin for pneumonia.  I have consulted pulmonary critical care.  Patient probably will need thoracentesis.  Kept n.p.o. for now. 2. Hypertension on Norvasc. 3. Microcytic hypochromic anemia -has had recent EGD and colonoscopy and as per the patient was unremarkable.  On iron supplements. 4. History of schizophrenia -medications are to be confirmed with pharmacy.  Are pending.   DVT prophylaxis: SCDs. Code Status: Full code. Family Communication: Patient's father. Disposition Plan: Home. Consults called: Pulmonary critical care. Admission status: Inpatient.   Rise Patience MD [facility] Pager [phone].  If 7PM-7AM, please contact  night-coverage [email or web address] Password [email or web address]  09/26/2017, 7:20 AM

## 2017-09-26 NOTE — Progress Notes (Signed)
Patient says he's missing certain home meds, RN checked home meds and they are listed, but not ordered for patient. MD paged.

## 2017-09-26 NOTE — Progress Notes (Signed)
Patient and patient's father asking frequently when the lung doctor will see him and if he can eat.  Dr. Pearline Cables paged and stated he will be here in a while.  Dr. Josem Kaufmann note said for patient to be NPO until pulmonary sees the patient.  Patient and family made aware.  Will continue to monitor.

## 2017-09-27 LAB — GLUCOSE, CAPILLARY
GLUCOSE-CAPILLARY: 132 mg/dL — AB (ref 65–99)
GLUCOSE-CAPILLARY: 145 mg/dL — AB (ref 65–99)
Glucose-Capillary: 141 mg/dL — ABNORMAL HIGH (ref 65–99)
Glucose-Capillary: 131 mg/dL — ABNORMAL HIGH (ref 65–99)

## 2017-09-27 LAB — BASIC METABOLIC PANEL
ANION GAP: 10 (ref 5–15)
BUN: 12 mg/dL (ref 6–20)
CO2: 25 mmol/L (ref 22–32)
Calcium: 8 mg/dL — ABNORMAL LOW (ref 8.9–10.3)
Chloride: 99 mmol/L — ABNORMAL LOW (ref 101–111)
Creatinine, Ser: 0.76 mg/dL (ref 0.61–1.24)
GFR calc Af Amer: >60 mL/min (ref >60)
GLUCOSE: 120 mg/dL — AB (ref 65–99)
Potassium: 3.7 mmol/L (ref 3.5–5.1)
Sodium: 134 mmol/L — ABNORMAL LOW (ref 135–145)

## 2017-09-27 LAB — HIV ANTIBODY (ROUTINE TESTING W REFLEX): HIV SCREEN 4TH GENERATION: NONREACTIVE

## 2017-09-27 LAB — CBC
HCT: 33.7 % — ABNORMAL LOW (ref 39.0–52.0)
Hemoglobin: 9.5 g/dL — ABNORMAL LOW (ref 13.0–17.0)
MCH: 22.2 pg — ABNORMAL LOW (ref 26.0–34.0)
MCHC: 28.2 g/dL — AB (ref 30.0–36.0)
MCV: 78.9 fL (ref 78.0–100.0)
Platelets: 396 10*3/uL (ref 150–400)
RBC: 4.27 MIL/uL (ref 4.22–5.81)
RDW: 22.5 % — AB (ref 11.5–15.5)
WBC: 13.5 10*3/uL — ABNORMAL HIGH (ref 4.0–10.5)

## 2017-09-27 LAB — VANCOMYCIN, TROUGH: Vancomycin Tr: 7 ug/mL — ABNORMAL LOW (ref 15–20)

## 2017-09-27 LAB — PH, BODY FLUID: pH, Body Fluid: 7.3

## 2017-09-27 MED ORDER — VANCOMYCIN HCL 10 G IV SOLR
1250.0000 mg | Freq: Three times a day (TID) | INTRAVENOUS | Status: DC
  Administered 2017-09-27 – 2017-09-29 (×5): 1250 mg via INTRAVENOUS
  Filled 2017-09-27 (×6): qty 1250

## 2017-09-27 NOTE — Progress Notes (Signed)
[facility]                                                                              Patient Demographics  Jeremy Dillon, is a 47 y.o. male, DOB - 1970/05/26, WCB:[id number]  Admit date - 09/26/2017   Admitting Physician Rise Patience, MD  Outpatient Primary MD for the patient is Patient, No Pcp Per  Outpatient specialists:   LOS - 1  days   Medical records reviewed and are as summarized below:    No chief complaint on file.      Brief summary   Patient is a 47 year old male with hypertension, schizophrenia, iron deficiency anemia presented to [facility] ED with left-sided chest pain and shortness of breath for last 2 days.  No fevers or chills.  In ED, patient was found to be tachycardia, dyspnea, CT scan of the chest showed very large left pleural effusion with possible loculation and collapse of much of the left lung, concerning for any underlying malignancy.  Patient was started on IV vancomycin and Levaquin and transferred to [facility] for further work-up.   Assessment & Plan    Principal Problem:   Acute respiratory failure with hypoxia (HCC) likely due to large left-sided pleural effusion with whiteout/collapse, community-acquired pneumonia -Still feels some short of breath, status post thoracentesis, 1.4 L removed, follow studies.   -Chest x-ray post thoracentesis showed mild decrease in the size of large left pleural effusion, no pneumothorax. -Urine strep antigen negative.  Blood cultures in process.  -Continue vancomycin and Levaquin to cover for possible empyema  Active Problems:    Hypertension Currently stable, continue Norvasc     Microcytic hypochromic anemia -Recent EGD and colonoscopy and per patient were unremarkable, continue iron supplements  History of schizophrenia -Continue Risperdal, Zoloft  Code Status: Full code DVT Prophylaxis:  SCD's Family Communication: Discussed in detail with the patient,  all imaging results, lab results explained to the patient    Disposition Plan:   Time Spent in minutes   25 minutes  Procedures:  CT chest Thoracentesis 6/8  Consultants:   Pulmonary critical care  Antimicrobials:   IV Levaquin 6/8     Medications  Scheduled Meds: . amLODipine  10 mg Oral Daily  . ferrous sulfate  325 mg Oral BID  . gabapentin  300 mg Oral TID  . OLANZapine  15 mg Oral QHS  . QUEtiapine  50 mg Oral QHS   Continuous Infusions: . levofloxacin (LEVAQUIN) IV Stopped (09/27/17 0211)  . vancomycin Stopped (09/27/17 0658)   PRN Meds:.acetaminophen **OR** acetaminophen, ondansetron **OR** ondansetron (ZOFRAN) IV   Antibiotics   Anti-infectives (From admission, onward)   Start     Dose/Rate Route Frequency Ordered Stop   09/27/17 0600  levofloxacin (LEVAQUIN) IVPB 750 mg  Status:  Discontinued     750 mg 100 mL/hr over 90 Minutes Intravenous Every 24 hours 09/26/17 0823 09/26/17 1228   09/27/17 0000  levofloxacin (LEVAQUIN) IVPB 750 mg     750 mg 100 mL/hr over 90 Minutes Intravenous Every 24 hours 09/26/17 1246     09/26/17 1300  vancomycin (VANCOCIN) IVPB  1000 mg/200 mL premix     1,000 mg 200 mL/hr over 60 Minutes Intravenous Every 8 hours 09/26/17 1246     09/26/17 0815  vancomycin (VANCOCIN) 1,500 mg in sodium chloride 0.9 % 500 mL IVPB  Status:  Discontinued     1,500 mg 250 mL/hr over 120 Minutes Intravenous  Once 09/26/17 0808 09/26/17 0824   09/26/17 0730  levofloxacin (LEVAQUIN) IVPB 750 mg  Status:  Discontinued     750 mg 100 mL/hr over 90 Minutes Intravenous Every 24 hours 09/26/17 0720 09/26/17 2725        Subjective:   Jeremy Dillon was seen and examined today.  Status post thoracentesis, still feeling somewhat short of breath.  No chest pain, no fevers or chills.  Patient denies dizziness, abdominal pain, N/V/D/C, new weakness, numbess, tingling. No acute events overnight.    Objective:   Vitals:   09/26/17 2113 09/26/17  2334 09/27/17 0402 09/27/17 1000  BP:  134/82 134/87   Pulse:  (!) 125 (!) 118   Resp:  (!) 22 18   Temp: 99.3 F (37.4 C) 98.9 F (37.2 C) 99.3 F (37.4 C)   TempSrc: Oral Oral Oral   SpO2:  92% 95% 97%  Weight:   99.7 kg (219 lb 12.8 oz)   Height:        Intake/Output Summary (Last 24 hours) at 09/27/2017 1059 Last data filed at 09/27/2017 0404 Gross per 24 hour  Intake 1510 ml  Output 900 ml  Net 610 ml     Wt Readings from Last 3 Encounters:  09/27/17 99.7 kg (219 lb 12.8 oz)     Exam    General: Alert and oriented x 3, NAD  Eyes:   HEENT:    Cardiovascular: S1 S2 auscultated, Regular rate and rhythm. No pedal edema b/l  Respiratory: Decreased breath sounds left side  Gastrointestinal: Soft, nontender, nondistended, + bowel sounds  Ext: no pedal edema bilaterally  Neuro: no new deficits  Musculoskeletal: No digital cyanosis, clubbing  Skin: No rashes  Psych: Normal affect and demeanor, alert and oriented x3    Data Reviewed:  I have personally reviewed following labs and imaging studies  Micro Results Recent Results (from the past 240 hour(s))  Gram stain     Status: None   Collection Time: 09/26/17  3:32 PM  Result Value Ref Range Status   Specimen Description PLEURAL LEFT  Final   Special Requests NONE  Final   Gram Stain   Final    WBC PRESENT, PREDOMINANTLY PMN NO ORGANISMS SEEN CYTOSPIN SMEAR Performed at [facility], [address], [place], [place] [address]    Report Status 09/26/2017 FINAL  Final    Radiology Reports Dg Chest Port 1 View  Result Date: 09/26/2017 CLINICAL DATA:  Large left pleural effusion. Status post thoracentesis. EXAM: PORTABLE CHEST 1 VIEW COMPARISON:  Prior today FINDINGS: Large left pleural effusion shows slight decrease in size since previous study. There is mildly improved aeration left upper lung field. Right lung remains clear. No pneumothorax visualized. IMPRESSION: Mild decrease in size of large  left pleural effusion. No pneumothorax visualized. Electronically Signed   By: Earle Gell M.D.   On: 09/26/2017 16:27   Dg Chest Port 1 View  Result Date: 09/26/2017 CLINICAL DATA:  Acute onset of shortness of breath. EXAM: PORTABLE CHEST 1 VIEW COMPARISON:  Chest radiograph and CT of the chest performed earlier today at 12:07 a.m. and 12:33 a.m. FINDINGS: A very  large left-sided pleural effusion is again noted, with underlying airspace opacification. Underlying vascular congestion is noted. No pneumothorax is seen. The cardiomediastinal silhouette is not well characterized due to the adjacent pleural effusion. No acute osseous abnormalities are identified. IMPRESSION: Very large left-sided pleural effusion again noted, with underlying airspace opacification. Vascular congestion seen. Electronically Signed   By: Garald Balding M.D.   On: 09/26/2017 06:11    Lab Data:  CBC: Recent Labs  Lab 09/26/17 0633 09/27/17 0723  WBC 14.5* 13.5*  NEUTROABS 11.7*  --   HGB 9.4* 9.5*  HCT 32.9* 33.7*  MCV 78.5 78.9  PLT 358 573   Basic Metabolic Panel: Recent Labs  Lab 09/26/17 0633 09/26/17 0831 09/27/17 0723  NA 134* 135 134*  K 3.3* 4.0 3.7  CL 97* 98* 99*  CO2 27 26 25   GLUCOSE 148* 120* 120*  BUN 9 9 12   CREATININE 0.86 0.81 0.76  CALCIUM 8.1* 8.1* 8.0*   GFR: Estimated Creatinine Clearance: 137.2 mL/min (by C-G formula based on SCr of 0.76 mg/dL). Liver Function Tests: Recent Labs  Lab 09/26/17 0633 09/26/17 0831  AST 18  --   ALT 23  --   ALKPHOS 55  --   BILITOT 0.6  --   PROT 6.6 6.2*  ALBUMIN 2.2*  --    No results for input(s): LIPASE, AMYLASE in the last 168 hours. No results for input(s): AMMONIA in the last 168 hours. Coagulation Profile: No results for input(s): INR, PROTIME in the last 168 hours. Cardiac Enzymes: No results for input(s): CKTOTAL, CKMB, CKMBINDEX, TROPONINI in the last 168 hours. BNP (last 3 results) No results for input(s): PROBNP in the  last 8760 hours. HbA1C: No results for input(s): HGBA1C in the last 72 hours. CBG: Recent Labs  Lab 09/26/17 0753 09/26/17 1644 09/26/17 2338 09/27/17 0800  GLUCAP 115* 129* 113* 141*   Lipid Profile: No results for input(s): CHOL, HDL, LDLCALC, TRIG, CHOLHDL, LDLDIRECT in the last 72 hours. Thyroid Function Tests: No results for input(s): TSH, T4TOTAL, FREET4, T3FREE, THYROIDAB in the last 72 hours. Anemia Panel: No results for input(s): VITAMINB12, FOLATE, FERRITIN, TIBC, IRON, RETICCTPCT in the last 72 hours. Urine analysis:    Component Value Date/Time   COLORURINE YELLOW 12/04/2011 0315   APPEARANCEUR CLEAR 12/04/2011 0315   LABSPEC 1.011 12/04/2011 0315   PHURINE 6.0 12/04/2011 0315   GLUCOSEU NEGATIVE 12/04/2011 0315   HGBUR NEGATIVE 12/04/2011 0315   BILIRUBINUR NEGATIVE 12/04/2011 0315   KETONESUR NEGATIVE 12/04/2011 0315   PROTEINUR NEGATIVE 12/04/2011 0315   UROBILINOGEN 0.2 12/04/2011 0315   NITRITE NEGATIVE 12/04/2011 0315   LEUKOCYTESUR NEGATIVE 12/04/2011 0315     Deretha Ertle M.D. [facility] 09/27/2017, 10:59 AM  Pager: [phone] Between 7am to 7pm - call Pager - [phone]  After 7pm go to [email or web address] - password [email or web address]  Call night coverage person covering after 7pm

## 2017-09-27 NOTE — Progress Notes (Addendum)
PCCM Interval Note  Successful thoracentesis on 6/8.  I reviewed the available labs today, consistent with a neutrophilic (0165 PMN's) exudate.  Clearly inflammatory.  The Gram stain is negative, amylase is negative.  This could be parapneumonic, even empyema despite the negative Gram stain.  Consider also malignancy.  I would wait for the cytology, culture data remaining studies to return to help guide [place] for next steps:  - If we believe that this is an empyema then tube drainage would be appropriate.   - If cytology is positive for malignancy clearly we would act on that information, discuss with oncology.   - If no evidence for either empyema or malignancy then I believe he would benefit from repeat thoracenteses to try and drain as much fluid as he can tolerate.  Once adequate drainage has been obtained then we should repeat his CT scan of the chest to assess his left lung parenchyma once he has reexpanded.  We will continue to follow with you.   Baltazar Apo, MD, PhD 09/27/2017, 10:59 AM [facility] [phone] or if no answer [phone]

## 2017-09-27 NOTE — Progress Notes (Signed)
CCMD called regarding pt heart rate in the 130s. Pt denies chest pain or recent ambulation. Pt states he was coughing frequently. Will continue to monitor.

## 2017-09-27 NOTE — Progress Notes (Signed)
Critical Lab Value : Lab reports Gram + cocco bacillus from pleural fluid removed on 09/26/2017.  This value reported to MD.

## 2017-09-27 NOTE — Plan of Care (Signed)
  Problem: Health Behavior/Discharge Planning: Goal: Ability to manage health-related needs will improve Outcome: Progressing   Problem: Clinical Measurements: Goal: Ability to maintain clinical measurements within normal limits will improve Outcome: Progressing   Problem: Clinical Measurements: Goal: Respiratory complications will improve Outcome: Progressing   

## 2017-09-27 NOTE — Progress Notes (Addendum)
ANTIBIOTIC CONSULT NOTE - INITIAL  Pharmacy Consult for vancomycin and levofloxacin  Indication: pneumonia  Allergies  Allergen Reactions  . Erythromycin Swelling    Cannot take any mycins  . Penicillins Swelling    Cannot take any cillins Has patient had a PCN reaction causing immediate rash, facial/tongue/throat swelling, SOB or lightheadedness with hypotension: yes Has patient had a PCN reaction causing severe rash involving mucus membranes or skin necrosis: unk Has patient had a PCN reaction that required hospitalization: unk Has patient had a PCN reaction occurring within the last 10 years: no If all of the above answers are "NO", then may proceed with Cephalosporin use.   . Sulfa Antibiotics     Cannot take any sulfa drugs    Patient Measurements: Height: 5\' 9"  (175.3 cm) Weight: 219 lb 12.8 oz (99.7 kg)(scale b) IBW/kg (Calculated) : 70.7   Vital Signs: Temp: 98.2 F (36.8 C) (06/09 1247) Temp Source: Oral (06/09 1247) BP: 127/80 (06/09 1247) Pulse Rate: 105 (06/09 1247) Intake/Output from previous day: 06/08 0701 - 06/09 0700 In: 1510 [P.O.:960; IV Piggyback:550] Out: 900 [Urine:900] Intake/Output from this shift: Total I/O In: 580 [P.O.:580] Out: 301 [Urine:300; Stool:1]  Labs: Recent Labs    09/26/17 0633 09/26/17 0831 09/27/17 0723  WBC 14.5*  --  13.5*  HGB 9.4*  --  9.5*  PLT 358  --  396  CREATININE 0.86 0.81 0.76   Estimated Creatinine Clearance: 137.2 mL/min (by C-G formula based on SCr of 0.76 mg/dL). Recent Labs    09/27/17 1213  [place] 7*     Microbiology: Recent Results (from the past 720 hour(s))  Culture, body fluid-bottle     Status: None (Preliminary result)   Collection Time: 09/26/17  3:32 PM  Result Value Ref Range Status   Specimen Description PLEURAL LEFT  Final   Special Requests NONE  Final   Gram Stain   Final    GRAM POSITIVE COCCOBACILLUS ANAEROBIC BOTTLE ONLY CRITICAL RESULT CALLED TO, READ BACK BY AND  VERIFIED WITH: R DUNN,RN AT 1142 09/27/17 BY L BENFIELD    Culture   Final    GRAM POSITIVE COCCOBACILLUS Performed at [facility], [place] [address], [place], [place] [address]    Report Status PENDING  Incomplete  Gram stain     Status: None   Collection Time: 09/26/17  3:32 PM  Result Value Ref Range Status   Specimen Description PLEURAL LEFT  Final   Special Requests NONE  Final   Gram Stain   Final    WBC PRESENT, PREDOMINANTLY PMN NO ORGANISMS SEEN CYTOSPIN SMEAR Performed at [facility], [place] [address], [place], [place] [address]    Report Status 09/26/2017 FINAL  Final    Medical History: Past Medical History:  Diagnosis Date  . Anemia   . Anxiety   . Bipolar disorder ([place])   . Depression   . GERD (gastroesophageal reflux disease)   . Hypertension   . Pneumonia   . Schizophrenia ([place])   . Smoking history 09/25/2017   vapes    Medications:  Scheduled:  . amLODipine  10 mg Oral Daily  . ferrous sulfate  325 mg Oral BID  . gabapentin  300 mg Oral TID  . OLANZapine  15 mg Oral QHS  . QUEtiapine  50 mg Oral QHS   Assessment: Jeremy Dillon is a 47 y.o. male transferring from [place] to [facility] on 6/8 for concern of pneumonia following a 2 day course  of shortness of breath and a nonproductive cough. PTA no fever reported by patient. CAT scan completed at outside facility shows large pleural effusion on left side and concern for malignancy vs empyema. Patient has thoracentesis yesterday with pleural fluid growing G+ coccobacillus.   WBC down to 13.5, patient is afebrile. Creatinine is stable. A vancomycin trough was drawn today and resulted below goal at 7. Adjustments as below.  Of note patient received the following medications before transfer to [facility]:  CTX x 1 at 0045 Vanc 2g IV x 1 at 0400 Levaquin x 1 at 0100  Patient also had blood and urine cultures performed at outside facility before transfer. No growth to date on these cultures per pharmacy  at [facility].   Goal of Therapy:  Vancomycin trough level 15-20 mcg/ml   Micro: 6/8 sputum:  6/8 gram stain:  6/8 BCX x2: pending  6/8 strep pneumo: neg  6/8 legionella: pending 6/8 pleural fluid: G+ coccobacillus   Plan:  Increase vancomycin to 1250mg  IV q8h Levaquin 750mg  q24h (received dose before admission on 6/8) Expected duration 7 days with resolution of temperature and/or normalization of WBC   Jalene Mullet, Pharm.D. PGY1 Pharmacy Resident 09/27/2017 2:27 PM Phone: [phone]

## 2017-09-28 DIAGNOSIS — J869 Pyothorax without fistula: Secondary | ICD-10-CM

## 2017-09-28 DIAGNOSIS — J90 Pleural effusion, not elsewhere classified: Secondary | ICD-10-CM

## 2017-09-28 LAB — GLUCOSE, CAPILLARY
GLUCOSE-CAPILLARY: 134 mg/dL — AB (ref 65–99)
Glucose-Capillary: 127 mg/dL — ABNORMAL HIGH (ref 65–99)
Glucose-Capillary: 139 mg/dL — ABNORMAL HIGH (ref 65–99)

## 2017-09-28 LAB — LEGIONELLA PNEUMOPHILA SEROGP 1 UR AG: L. pneumophila Serogp 1 Ur Ag: NEGATIVE

## 2017-09-28 MED ORDER — VENLAFAXINE HCL ER 75 MG PO CP24
75.0000 mg | ORAL_CAPSULE | Freq: Every day | ORAL | Status: DC
  Administered 2017-09-28 – 2017-10-06 (×7): 75 mg via ORAL
  Filled 2017-09-28 (×8): qty 1

## 2017-09-28 MED ORDER — METOPROLOL TARTRATE 25 MG PO TABS
25.0000 mg | ORAL_TABLET | Freq: Two times a day (BID) | ORAL | Status: DC
  Administered 2017-09-28 – 2017-09-30 (×5): 25 mg via ORAL
  Filled 2017-09-28 (×5): qty 1

## 2017-09-28 NOTE — Progress Notes (Signed)
Pt requests his PTA med (Effexor 75 mg) be added to his daily meds. MD notified.

## 2017-09-28 NOTE — Progress Notes (Addendum)
   Name: Jeremy Dillon MRN: [id number] DOB: May 07, 1970    ADMISSION DATE:  09/26/2017 CONSULTATION DATE: 09/26/2017  REFERRING MD : Primary  CHIEF COMPLAINT: Dyspnea  BRIEF PATIENT DESCRIPTION: Well-developed male no acute distress  SIGNIFICANT EVENTS  09/26/2017  STUDIES:  CT scan with large left effusion   HISTORY OF PRESENT ILLNESS:   47 year old male placed approximately 3 times a week, is known to suggest active disorder as well controlled with medications, history of pneumonia as documented by radiographic imaging in February 2019.  At the time he had pneumonia in February 2019 primary symptom was dyspnea.  He was treated with Tamiflu currently on therapy improved and was discharged home    SUBJECTIVE:  Still feels short of breath VITAL SIGNS: Temp:  [98.9 F (37.2 C)-100.1 F (37.8 C)] 98.9 F (37.2 C) (06/10 1151) Pulse Rate:  [115-131] 115 (06/10 1151) Resp:  [18-20] 20 (06/10 1151) BP: (121-136)/(78-93) 123/78 (06/10 1151) SpO2:  [93 %-96 %] 96 % (06/10 1151) Weight:  [221 lb 6.4 oz (100.4 kg)] 221 lb 6.4 oz (100.4 kg) (06/10 0403)  PHYSICAL EXAMINATION: General: 47 year old white male currently resting in bed no acute distress HEENT normocephalic atraumatic no jugular venous distention Pulmonary: Remarkably diminished on the left side, no accessory use. Cardiac: Regular rate and rhythm Abdomen: Soft nontender Neuro: Awake oriented no focal deficits Extremities/muscular skeletal warm dry intact no edema  Recent Labs  Lab 09/26/17 0633 09/26/17 0831 09/27/17 0723  NA 134* 135 134*  K 3.3* 4.0 3.7  CL 97* 98* 99*  CO2 27 26 25   BUN 9 9 12   CREATININE 0.86 0.81 0.76  GLUCOSE 148* 120* 120*   Recent Labs  Lab 09/26/17 0633 09/27/17 0723  HGB 9.4* 9.5*  HCT 32.9* 33.7*  WBC 14.5* 13.5*  PLT 358 396   Dg Chest Port 1 View  Result Date: 09/26/2017 CLINICAL DATA:  Large left pleural effusion. Status post thoracentesis. EXAM: PORTABLE CHEST 1 VIEW  COMPARISON:  Prior today FINDINGS: Large left pleural effusion shows slight decrease in size since previous study. There is mildly improved aeration left upper lung field. Right lung remains clear. No pneumothorax visualized. IMPRESSION: Mild decrease in size of large left pleural effusion. No pneumothorax visualized. Electronically Signed   By: Earle Gell M.D.   On: 09/26/2017 16:27        ASSESSMENT:  Acute respiratory failure with hypoxia [facility]) Active Problems: Pleural effusion, left empyema  Hx of pna 2/19 Hypertension Microcytic hypochromic anemia Dyspnea recurrent  Schizoaffective do   Discussion: Staph Left empyema (sensitivities pending)  Plan Continue antibiotics Will refer to thoracic surgery for video-assisted thoracotomy  Erick Colace ACNP-BC [place] Pager # [phone] OR # [phone] if no answer  Attending Note:  47 year old male with empyema.  On exam, decreased BS on the left.  I reviewed CXR myself, pleural effusion noted and on U/S there are loculations noted with septae.  Discussed with PCCM-NP and CVTS.  Pleural effusion: empyema  - CVTS to evaluate for a vats  - No further thoras  PNA:  - Abx as ordered  - F/U on cultures  Hypoxemia:  - Titrate O2 for sat of 88-92%  PCCM will sign off, please call back if needed.  Patient seen and examined, agree with above note.  I dictated the care and orders written for this patient under my direction.  Rush Farmer, [place]

## 2017-09-28 NOTE — Progress Notes (Addendum)
[facility]                                                                              Patient Demographics  Jeremy Dillon, is a 47 y.o. male, DOB - 24-Dec-1970, NID:[id number]  Admit date - 09/26/2017   Admitting Physician Rise Patience, MD  Outpatient Primary MD for the patient is Patient, No Pcp Per  Outpatient specialists:   LOS - 2  days   Medical records reviewed and are as summarized below:    No chief complaint on file.      Brief summary   Patient is a 47 year old male with hypertension, schizophrenia, iron deficiency anemia presented to [facility] ED with left-sided chest pain and shortness of breath for last 2 days.  No fevers or chills.  In ED, patient was found to be tachycardia, dyspnea, CT scan of the chest showed very large left pleural effusion with possible loculation and collapse of much of the left lung, concerning for any underlying malignancy.  Patient was started on IV vancomycin and Levaquin and transferred to [facility] for further work-up.   Assessment & Plan    Principal Problem:   Acute respiratory failure with hypoxia (HCC) likely due to large left-sided pleural effusion with whiteout/collapse, community-acquired pneumonia -Still feels some short of breath, status post thoracentesis, 1.4 L removed, follow studies.   -Chest x-ray post thoracentesis showed mild decrease in the size of large left pleural effusion, no pneumothorax. -Urine strep antigen negative.  Blood cultures in process.  - pleural fluid cultures showed gram-positive coccobacillus, will likely needs chest tube, TCTS consulted, spoke with Thurmond Butts, [place] for consult.   Active Problems:    Hypertension with tachycardia Hold amlodipine, placed on Lopressor 25 mg twice a day     Microcytic hypochromic anemia -Recent EGD and colonoscopy and per patient were unremarkable, continue iron supplements  History of schizophrenia -Continue Risperdal,  Zoloft  Code Status: Full code DVT Prophylaxis:  SCD's Family Communication: Discussed in detail with the patient, all imaging results, lab results explained to the patient    Disposition Plan:   Time Spent in minutes   25 minutes  Procedures:  CT chest Thoracentesis 6/8  Consultants:   Pulmonary critical care  Antimicrobials:   IV Levaquin 6/8  IV vancomycin   Medications  Scheduled Meds: . ferrous sulfate  325 mg Oral BID  . gabapentin  300 mg Oral TID  . metoprolol tartrate  25 mg Oral BID  . OLANZapine  15 mg Oral QHS  . QUEtiapine  50 mg Oral QHS   Continuous Infusions: . levofloxacin (LEVAQUIN) IV Stopped (09/28/17 0041)  . vancomycin Stopped (09/28/17 0730)   PRN Meds:.acetaminophen **OR** acetaminophen, ondansetron **OR** ondansetron (ZOFRAN) IV   Antibiotics   Anti-infectives (From admission, onward)   Start     Dose/Rate Route Frequency Ordered Stop   09/27/17 2100  vancomycin (VANCOCIN) 1,250 mg in sodium chloride 0.9 % 250 mL IVPB     1,250 mg 166.7 mL/hr over 90 Minutes Intravenous Every 8 hours 09/27/17 1426     09/27/17 0600  levofloxacin (LEVAQUIN) IVPB 750 mg  Status:  Discontinued     750 mg 100 mL/hr over 90 Minutes Intravenous Every 24 hours 09/26/17 0823 09/26/17 1228   09/27/17 0000  levofloxacin (LEVAQUIN) IVPB 750 mg     750 mg 100 mL/hr over 90 Minutes Intravenous Every 24 hours 09/26/17 1246     09/26/17 1300  vancomycin (VANCOCIN) IVPB 1000 mg/200 mL premix  Status:  Discontinued     1,000 mg 200 mL/hr over 60 Minutes Intravenous Every 8 hours 09/26/17 1246 09/27/17 1426   09/26/17 0815  vancomycin (VANCOCIN) 1,500 mg in sodium chloride 0.9 % 500 mL IVPB  Status:  Discontinued     1,500 mg 250 mL/hr over 120 Minutes Intravenous  Once 09/26/17 0808 09/26/17 0824   09/26/17 0730  levofloxacin (LEVAQUIN) IVPB 750 mg  Status:  Discontinued     750 mg 100 mL/hr over 90 Minutes Intravenous Every 24 hours 09/26/17 0720 09/26/17 8657         Subjective:   Jeremy Dillon was seen and examined today.  Feels shortness of breath has not improved still feeling winded.  No chest pain, fevers or chills.  Tachycardia on ambulation..  Patient denies dizziness, abdominal pain, N/V/D/C, new weakness, numbess, tingling. No acute events overnight.    Objective:   Vitals:   09/27/17 1247 09/27/17 1926 09/28/17 0403 09/28/17 0837  BP: 127/80 134/84 121/86 (!) 136/93  Pulse: (!) 105 (!) 127 (!) 131 (!) 130  Resp: 18 18 20    Temp: 98.2 F (36.8 C) 99.7 F (37.6 C) 100.1 F (37.8 C)   TempSrc: Oral Oral Oral   SpO2: 95% 95% 93%   Weight:   100.4 kg (221 lb 6.4 oz)   Height:        Intake/Output Summary (Last 24 hours) at 09/28/2017 0857 Last data filed at 09/28/2017 0700 Gross per 24 hour  Intake 2420 ml  Output 2451 ml  Net -31 ml     Wt Readings from Last 3 Encounters:  09/28/17 100.4 kg (221 lb 6.4 oz)     Exam    General: Alert and oriented x 3, NAD  Eyes:   HEENT:   Cardiovascular: S1 S2 clear, RRR, tachycardia, No pedal edema b/l  Respiratory: Decreased breath sounds on the left   gastrointestinal: Soft, nontender, nondistended, + bowel sounds  Ext: no pedal edema bilaterally  Neuro: no new deficits  Musculoskeletal: No digital cyanosis, clubbing  Skin: No rashes  Psych: Normal affect and demeanor, alert and oriented x3     Data Reviewed:  I have personally reviewed following labs and imaging studies  Micro Results Recent Results (from the past 240 hour(s))  Culture, blood (routine x 2)     Status: None (Preliminary result)   Collection Time: 09/26/17  6:40 AM  Result Value Ref Range Status   Specimen Description BLOOD RIGHT ANTECUBITAL  Final   Special Requests   Final    BOTTLES DRAWN AEROBIC ONLY Blood Culture adequate volume   Culture   Final    NO GROWTH 1 DAY Performed at [facility], [address] [place], [place] [address]    Report Status PENDING  Incomplete    Culture, blood (routine x 2)     Status: None (Preliminary result)   Collection Time: 09/26/17  6:47 AM  Result Value Ref Range Status   Specimen Description BLOOD LEFT HAND  Final   Special Requests   Final    BOTTLES DRAWN AEROBIC AND ANAEROBIC Blood Culture adequate volume  Culture   Final    NO GROWTH 1 DAY Performed at [facility], [place] [address] [place], [place] [address]    Report Status PENDING  Incomplete  Culture, body fluid-bottle     Status: None (Preliminary result)   Collection Time: 09/26/17  3:32 PM  Result Value Ref Range Status   Specimen Description PLEURAL LEFT  Final   Special Requests NONE  Final   Gram Stain   Final    GRAM POSITIVE COCCOBACILLUS IN BOTH AEROBIC AND ANAEROBIC BOTTLES CRITICAL RESULT CALLED TO, READ BACK BY AND VERIFIED WITH: R DUNN,RN AT 9675 09/27/17 BY L BENFIELD    Culture   Final    GRAM POSITIVE COCCOBACILLUS Performed at [facility], [place] [address] [place], [place] [address]    Report Status PENDING  Incomplete  Gram stain     Status: None   Collection Time: 09/26/17  3:32 PM  Result Value Ref Range Status   Specimen Description PLEURAL LEFT  Final   Special Requests NONE  Final   Gram Stain   Final    WBC PRESENT, PREDOMINANTLY PMN NO ORGANISMS SEEN CYTOSPIN SMEAR Performed at [facility], [place] [address] [place],  [address]    Report Status 09/26/2017 FINAL  Final    Radiology Reports Dg Chest Port 1 View  Result Date: 09/26/2017 CLINICAL DATA:  Large left pleural effusion. Status post thoracentesis. EXAM: PORTABLE CHEST 1 VIEW COMPARISON:  Prior today FINDINGS: Large left pleural effusion shows slight decrease in size since previous study. There is mildly improved aeration left upper lung field. Right lung remains clear. No pneumothorax visualized. IMPRESSION: Mild decrease in size of large left pleural effusion. No pneumothorax visualized. Electronically Signed   By: Earle Gell M.D.   On:  09/26/2017 16:27   Dg Chest Port 1 View  Result Date: 09/26/2017 CLINICAL DATA:  Acute onset of shortness of breath. EXAM: PORTABLE CHEST 1 VIEW COMPARISON:  Chest radiograph and CT of the chest performed earlier today at 12:07 a.m. and 12:33 a.m. FINDINGS: A very large left-sided pleural effusion is again noted, with underlying airspace opacification. Underlying vascular congestion is noted. No pneumothorax is seen. The cardiomediastinal silhouette is not well characterized due to the adjacent pleural effusion. No acute osseous abnormalities are identified. IMPRESSION: Very large left-sided pleural effusion again noted, with underlying airspace opacification. Vascular congestion seen. Electronically Signed   By: Garald Balding M.D.   On: 09/26/2017 06:11    Lab Data:  CBC: Recent Labs  Lab 09/26/17 0633 09/27/17 0723  WBC 14.5* 13.5*  NEUTROABS 11.7*  --   HGB 9.4* 9.5*  HCT 32.9* 33.7*  MCV 78.5 78.9  PLT 358 935   Basic Metabolic Panel: Recent Labs  Lab 09/26/17 0633 09/26/17 0831 09/27/17 0723  NA 134* 135 134*  K 3.3* 4.0 3.7  CL 97* 98* 99*  CO2 27 26 25   GLUCOSE 148* 120* 120*  BUN 9 9 12   CREATININE 0.86 0.81 0.76  CALCIUM 8.1* 8.1* 8.0*   GFR: Estimated Creatinine Clearance: 137.7 mL/min (by C-G formula based on SCr of 0.76 mg/dL). Liver Function Tests: Recent Labs  Lab 09/26/17 0633 09/26/17 0831  AST 18  --   ALT 23  --   ALKPHOS 55  --   BILITOT 0.6  --   PROT 6.6 6.2*  ALBUMIN 2.2*  --    No results for input(s): LIPASE, AMYLASE in the last 168 hours. No results  for input(s): AMMONIA in the last 168 hours. Coagulation Profile: No results for input(s): INR, PROTIME in the last 168 hours. Cardiac Enzymes: No results for input(s): CKTOTAL, CKMB, CKMBINDEX, TROPONINI in the last 168 hours. BNP (last 3 results) No results for input(s): PROBNP in the last 8760 hours. HbA1C: No results for input(s): HGBA1C in the last 72 hours. CBG: Recent Labs  Lab  09/27/17 0800 09/27/17 1244 09/27/17 1634 09/27/17 2316 09/28/17 0733  GLUCAP 141* 132* 145* 131* 127*   Lipid Profile: No results for input(s): CHOL, HDL, LDLCALC, TRIG, CHOLHDL, LDLDIRECT in the last 72 hours. Thyroid Function Tests: No results for input(s): TSH, T4TOTAL, FREET4, T3FREE, THYROIDAB in the last 72 hours. Anemia Panel: No results for input(s): VITAMINB12, FOLATE, FERRITIN, TIBC, IRON, RETICCTPCT in the last 72 hours. Urine analysis:    Component Value Date/Time   COLORURINE YELLOW 12/04/2011 0315   APPEARANCEUR CLEAR 12/04/2011 0315   LABSPEC 1.011 12/04/2011 0315   PHURINE 6.0 12/04/2011 0315   GLUCOSEU NEGATIVE 12/04/2011 0315   HGBUR NEGATIVE 12/04/2011 0315   BILIRUBINUR NEGATIVE 12/04/2011 0315   KETONESUR NEGATIVE 12/04/2011 0315   PROTEINUR NEGATIVE 12/04/2011 0315   UROBILINOGEN 0.2 12/04/2011 0315   NITRITE NEGATIVE 12/04/2011 0315   LEUKOCYTESUR NEGATIVE 12/04/2011 0315     Ripudeep Rai M.D. [facility] 09/28/2017, 8:57 AM  Pager: [phone] Between 7am to 7pm - call Pager - [phone]  After 7pm go to [email or web address] - password [email or web address]  Call night coverage person covering after 7pm

## 2017-09-28 NOTE — Plan of Care (Signed)
  Problem: Safety: Goal: Ability to remain free from injury will improve Outcome: Progressing   

## 2017-09-28 NOTE — Plan of Care (Signed)
  Problem: Health Behavior/Discharge Planning: Goal: Ability to manage health-related needs will improve Outcome: Progressing   Problem: Clinical Measurements: Goal: Ability to maintain clinical measurements within normal limits will improve Outcome: Progressing   Problem: Clinical Measurements: Goal: Respiratory complications will improve Outcome: Progressing   

## 2017-09-28 NOTE — Consult Note (Signed)
Reason for Consult:loculated left pleural effusion Referring Physician: Dr. Octaviano Batty is an 47 y.o. male.  HPI: Mr. Kerney is a 47 yo man with a past history significant for hypertension, schizophrenia, bipolar disorder, reflux, pneumonia, anxiety and tobacco abuse. Smoked cigarettes for many years more recently has been vaping. Had a respiratory infection, bronchitis or pneumonia back in February. Presented to [facility] ED with a 2 day history of shortness of breath with minimal activity and pleuritic chest/ back pain. Was tachycardic. CXR showed a large pleural effusion. CT showed a multiloculated left pleural effusion. There was some mild mediastinal adenopathy. Started on vancomycin and levaquin. Thoracentesis drained 1.4 L of fluid with some symptomatic relief. Fluid c/w empyema.   Past Medical History:  Diagnosis Date  . Anemia   . Anxiety   . Bipolar disorder ([place])   . Depression   . GERD (gastroesophageal reflux disease)   . Hypertension   . Pneumonia   . Schizophrenia ([place])   . Smoking history 09/25/2017   vapes    Past Surgical History:  Procedure Laterality Date  . shoulder surgery      Family History  Problem Relation Age of Onset  . Diabetes Mellitus II Brother   . Breast cancer Paternal Grandmother   . Prostate cancer Paternal Grandfather     Social History:  reports that he has quit smoking. He uses smokeless tobacco. He reports that he drinks alcohol. He reports that he has current or past drug history. Drug: Marijuana.  Allergies:  Allergies  Allergen Reactions  . Erythromycin Swelling    Cannot take any mycins  . Penicillins Swelling    Cannot take any cillins Has patient had a PCN reaction causing immediate rash, facial/tongue/throat swelling, SOB or lightheadedness with hypotension: yes Has patient had a PCN reaction causing severe rash involving mucus membranes or skin necrosis: unk Has patient had a PCN reaction that required  hospitalization: unk Has patient had a PCN reaction occurring within the last 10 years: no If all of the above answers are "NO", then may proceed with Cephalosporin use.   . Sulfa Antibiotics     Cannot take any sulfa drugs    Medications:  Scheduled: . ferrous sulfate  325 mg Oral BID  . gabapentin  300 mg Oral TID  . metoprolol tartrate  25 mg Oral BID  . OLANZapine  15 mg Oral QHS  . QUEtiapine  50 mg Oral QHS  . venlafaxine XR  75 mg Oral Daily    Results for orders placed or performed during the hospital encounter of 09/26/17 (from the past 48 hour(s))  Glucose, capillary     Status: Abnormal   Collection Time: 09/26/17 11:38 PM  Result Value Ref Range   Glucose-Capillary 113 (H) 65 - 99 mg/dL   Comment 1 Notify RN    Comment 2 Document in Chart   Basic metabolic panel     Status: Abnormal   Collection Time: 09/27/17  7:23 AM  Result Value Ref Range   Sodium 134 (L) 135 - 145 mmol/L   Potassium 3.7 3.5 - 5.1 mmol/L   Chloride 99 (L) 101 - 111 mmol/L   CO2 25 22 - 32 mmol/L   Glucose, Bld 120 (H) 65 - 99 mg/dL   BUN 12 6 - 20 mg/dL   Creatinine, Ser 0.76 0.61 - 1.24 mg/dL   Calcium 8.0 (L) 8.9 - 10.3 mg/dL   GFR calc non Af Amer >60 >60 mL/min  GFR calc Af Amer >60 >60 mL/min    Comment: (NOTE) The eGFR has been calculated using the CKD EPI equation. This calculation has not been validated in all clinical situations. eGFR's persistently <60 mL/min signify possible Chronic Kidney Disease.    Anion gap 10 5 - 15    Comment: Performed at [facility] [address], [place], [place] [address]  CBC     Status: Abnormal   Collection Time: 09/27/17  7:23 AM  Result Value Ref Range   WBC 13.5 (H) 4.0 - 10.5 K/uL   RBC 4.27 4.22 - 5.81 MIL/uL   Hemoglobin 9.5 (L) 13.0 - 17.0 g/dL   HCT 33.7 (L) 39.0 - 52.0 %   MCV 78.9 78.0 - 100.0 fL   MCH 22.2 (L) 26.0 - 34.0 pg   MCHC 28.2 (L) 30.0 - 36.0 g/dL   RDW 22.5 (H) 11.5 - 15.5 %   Platelets 396 150 - 400 K/uL     Comment: Performed at [facility], [place] [address], [place], [place] [address]  Glucose, capillary     Status: Abnormal   Collection Time: 09/27/17  8:00 AM  Result Value Ref Range   Glucose-Capillary 141 (H) 65 - 99 mg/dL   Comment 1 Notify RN    Comment 2 Document in Chart   Vancomycin, trough     Status: Abnormal   Collection Time: 09/27/17 12:13 PM  Result Value Ref Range   Vancomycin Tr 7 (L) 15 - 20 ug/mL    Comment: Performed at [facility], [place] [address] [place], [place] [address]  Glucose, capillary     Status: Abnormal   Collection Time: 09/27/17 12:44 PM  Result Value Ref Range   Glucose-Capillary 132 (H) 65 - 99 mg/dL   Comment 1 Notify RN    Comment 2 Document in Chart   Glucose, capillary     Status: Abnormal   Collection Time: 09/27/17  4:34 PM  Result Value Ref Range   Glucose-Capillary 145 (H) 65 - 99 mg/dL   Comment 1 Notify RN    Comment 2 Document in Chart   Glucose, capillary     Status: Abnormal   Collection Time: 09/27/17 11:16 PM  Result Value Ref Range   Glucose-Capillary 131 (H) 65 - 99 mg/dL  Glucose, capillary     Status: Abnormal   Collection Time: 09/28/17  7:33 AM  Result Value Ref Range   Glucose-Capillary 127 (H) 65 - 99 mg/dL   Comment 1 Notify RN   Glucose, capillary     Status: Abnormal   Collection Time: 09/28/17 11:17 AM  Result Value Ref Range   Glucose-Capillary 134 (H) 65 - 99 mg/dL   Comment 1 Notify RN     No results found.  Review of Systems  Constitutional: Positive for fever and malaise/fatigue. Negative for chills and diaphoresis.  Eyes: Negative for blurred vision and double vision.  Respiratory: Positive for cough and shortness of breath. Negative for hemoptysis, sputum production and wheezing.   Cardiovascular: Positive for chest pain (pleuritic).  Neurological: Negative for focal weakness and loss of consciousness.  Psychiatric/Behavioral: Positive for depression. Negative for hallucinations. The  patient is nervous/anxious.   All other systems reviewed and are negative.  Blood pressure 123/78, pulse (!) 115, temperature 98.9 F (37.2 C), temperature source Oral, resp. rate 20, height '5\' 9"'$  (1.753 m), weight 221 lb 6.4 oz (100.4 kg), SpO2 96 %. Physical Exam  Vitals reviewed. Constitutional:  He is oriented to person, place, and time. He appears well-developed and well-nourished. No distress.  HENT:  Head: Normocephalic and atraumatic.  Mouth/Throat: No oropharyngeal exudate.  Eyes: Pupils are equal, round, and reactive to light. Conjunctivae and EOM are normal. No scleral icterus.  Neck: No thyromegaly present.  Cardiovascular: Regular rhythm, normal heart sounds and intact distal pulses. Exam reveals no gallop and no friction rub.  No murmur heard. tachycardic  Respiratory: Effort normal. No respiratory distress. He has no wheezes. He has no rales.  Markedly diminished BS on left  GI: Soft. He exhibits no distension. There is no tenderness.  Musculoskeletal: He exhibits no edema.  Lymphadenopathy:    He has no cervical adenopathy.  Neurological: He is alert and oriented to person, place, and time. No cranial nerve deficit. He exhibits normal muscle tone.  Skin: Skin is warm and dry.    Assessment/Plan: 47 yo man with schizophrenia, bipolar disorder, hypertension, tobacco use and reflux presents with a 2 day history of shortness of breath with exertion and pleuritic CP. Found to have a large loculated left pleural effusion with complete atelectasis of the left lung. Fluid c/w exudate. Differential diagnosis includes parapneumonic effusion and malignant effusion. I suspect this is parapneumonic, but cannot r/o the possibility of cancer. I suspect the adenopathy noted on CT is just reactive.  He is not in any distress at present.  I think the best option for him is Left VATS to drain the effusion. Will need bronchoscopy at the same setting to r/o endobronchial lesion.  I  discussed the general nature of the procedure, including the need for general anesthesia, the incisions to be used and the use of drainage tubes postoperatively with Mr. Overbeck and his parents. We discussed the expected hospital stay, overall recovery and short and long term outcomes. They understand the risks include, but are not limited to death, stroke, MI, DVT/PE, bleeding, possible need for transfusion, infections, air leaks, as well as other unforeseeable complications.  They understand and accept the risks and agree to proceed.  Plan Or Wednesday 6/10  Melrose Nakayama 09/28/2017, 7:28 PM

## 2017-09-28 NOTE — Progress Notes (Signed)
Patients HR 150 while ambulating, patient had cold sweats that didn't last long. Will continue to monitor.

## 2017-09-29 LAB — BASIC METABOLIC PANEL
ANION GAP: 10 (ref 5–15)
BUN: 8 mg/dL (ref 6–20)
CO2: 29 mmol/L (ref 22–32)
CREATININE: 0.63 mg/dL (ref 0.61–1.24)
Calcium: 8.3 mg/dL — ABNORMAL LOW (ref 8.9–10.3)
Chloride: 99 mmol/L — ABNORMAL LOW (ref 101–111)
GFR calc non Af Amer: >60 mL/min (ref >60)
Glucose, Bld: 117 mg/dL — ABNORMAL HIGH (ref 65–99)
Potassium: 3.7 mmol/L (ref 3.5–5.1)
SODIUM: 138 mmol/L (ref 135–145)

## 2017-09-29 LAB — CULTURE, BODY FLUID W GRAM STAIN -BOTTLE

## 2017-09-29 LAB — CBC
HEMATOCRIT: 32.2 % — AB (ref 39.0–52.0)
Hemoglobin: 9.1 g/dL — ABNORMAL LOW (ref 13.0–17.0)
MCH: 22.2 pg — ABNORMAL LOW (ref 26.0–34.0)
MCHC: 28.3 g/dL — AB (ref 30.0–36.0)
MCV: 78.7 fL (ref 78.0–100.0)
Platelets: 422 10*3/uL — ABNORMAL HIGH (ref 150–400)
RBC: 4.09 MIL/uL — ABNORMAL LOW (ref 4.22–5.81)
RDW: 22.2 % — AB (ref 11.5–15.5)
WBC: 17.5 10*3/uL — AB (ref 4.0–10.5)

## 2017-09-29 LAB — VANCOMYCIN, TROUGH: VANCOMYCIN TR: 8 ug/mL — AB (ref 15–20)

## 2017-09-29 LAB — CULTURE, BODY FLUID-BOTTLE

## 2017-09-29 MED ORDER — CEFTRIAXONE SODIUM 2 G IJ SOLR
2.0000 g | Freq: Every day | INTRAMUSCULAR | Status: DC
  Administered 2017-09-29 – 2017-10-03 (×5): 2 g via INTRAVENOUS
  Filled 2017-09-29 (×6): qty 20

## 2017-09-29 NOTE — Progress Notes (Signed)
[facility]                                                                              Patient Demographics  Jeremy Dillon, is a 47 y.o. male, DOB - Jan 19, 1971, NOB:[id number]  Admit date - 09/26/2017   Admitting Physician Rise Patience, MD  Outpatient Primary MD for the patient is Patient, No Pcp Per  Outpatient specialists:   LOS - 3  days   Medical records reviewed and are as summarized below:    No chief complaint on file.      Brief summary   Patient is a 47 year old male with hypertension, schizophrenia, iron deficiency anemia presented to [facility] ED with left-sided chest pain and shortness of breath for last 2 days.  No fevers or chills.  In ED, patient was found to be tachycardia, dyspnea, CT scan of the chest showed very large left pleural effusion with possible loculation and collapse of much of the left lung, concerning for any underlying malignancy.  Patient was started on IV vancomycin and Levaquin and transferred to [facility] for further work-up.   Assessment & Plan    Principal Problem:   Acute respiratory failure with hypoxia (HCC) likely due to large left-sided pleural effusion with whiteout/collapse, community-acquired pneumonia -Still feels some short of breath, status post thoracentesis, 1.4 L removed, follow studies.   -Chest x-ray post thoracentesis showed mild decrease in the size of large left pleural effusion, no pneumothorax. -Urine strep antigen negative.  Blood cultures negative so far.  -Pleural fluid cultures showed MSSA and beta strep.  CT VS consulted, plan for VATS on 6/12  Active Problems:    Hypertension with tachycardia Hold amlodipine, placed on Lopressor 25 mg twice a day     Microcytic hypochromic anemia -Recent EGD and colonoscopy and per patient were unremarkable, continue iron supplements  History of schizophrenia -Continue Risperdal, Zoloft  Code Status: Full code DVT Prophylaxis:   SCD's Family Communication: Discussed in detail with the patient, all imaging results, lab results explained to the patient    Disposition Plan:   Time Spent in minutes   25 minutes  Procedures:  CT chest Thoracentesis 6/8  Consultants:   Pulmonary critical care TCTS  Antimicrobials:   IV Levaquin 6/8  IV vancomycin   Medications  Scheduled Meds: . ferrous sulfate  325 mg Oral BID  . gabapentin  300 mg Oral TID  . metoprolol tartrate  25 mg Oral BID  . OLANZapine  15 mg Oral QHS  . QUEtiapine  50 mg Oral QHS  . venlafaxine XR  75 mg Oral Daily   Continuous Infusions: . levofloxacin (LEVAQUIN) IV Stopped (09/29/17 0452)  . vancomycin Stopped (09/29/17 0228)   PRN Meds:.acetaminophen **OR** acetaminophen, ondansetron **OR** ondansetron (ZOFRAN) IV   Antibiotics   Anti-infectives (From admission, onward)   Start     Dose/Rate Route Frequency Ordered Stop   09/27/17 2100  vancomycin (VANCOCIN) 1,250 mg in sodium chloride 0.9 % 250 mL IVPB     1,250 mg 166.7 mL/hr over 90 Minutes Intravenous Every 8 hours 09/27/17 1426     09/27/17 0600  levofloxacin (LEVAQUIN) IVPB 750 mg  Status:  Discontinued     750 mg 100 mL/hr over 90 Minutes Intravenous Every 24 hours 09/26/17 0823 09/26/17 1228   09/27/17 0000  levofloxacin (LEVAQUIN) IVPB 750 mg     750 mg 100 mL/hr over 90 Minutes Intravenous Every 24 hours 09/26/17 1246     09/26/17 1300  vancomycin (VANCOCIN) IVPB 1000 mg/200 mL premix  Status:  Discontinued     1,000 mg 200 mL/hr over 60 Minutes Intravenous Every 8 hours 09/26/17 1246 09/27/17 1426   09/26/17 0815  vancomycin (VANCOCIN) 1,500 mg in sodium chloride 0.9 % 500 mL IVPB  Status:  Discontinued     1,500 mg 250 mL/hr over 120 Minutes Intravenous  Once 09/26/17 0808 09/26/17 0824   09/26/17 0730  levofloxacin (LEVAQUIN) IVPB 750 mg  Status:  Discontinued     750 mg 100 mL/hr over 90 Minutes Intravenous Every 24 hours 09/26/17 0720 09/26/17 9678         Subjective:   Jeremy Dillon was seen and examined today.  Still feels some shortness of breath, no chest pain.  No fevers or chills.  Feels winded on ambulation.   Patient denies dizziness, abdominal pain, N/V/D/C, new weakness, numbess, tingling. No acute events overnight.    Objective:   Vitals:   09/28/17 1151 09/28/17 2000 09/29/17 0425 09/29/17 0851  BP: 123/78 121/81 132/90 136/78  Pulse: (!) 115 (!) 131 (!) 121 (!) 131  Resp: 20 18 20 20   Temp: 98.9 F (37.2 C) 98.6 F (37 C) 99.6 F (37.6 C) 97.7 F (36.5 C)  TempSrc: Oral Oral Oral   SpO2: 96% 95% 94% 95%  Weight:   101.1 kg (222 lb 14.4 oz)   Height:        Intake/Output Summary (Last 24 hours) at 09/29/2017 1010 Last data filed at 09/29/2017 0900 Gross per 24 hour  Intake 1830 ml  Output 1500 ml  Net 330 ml     Wt Readings from Last 3 Encounters:  09/29/17 101.1 kg (222 lb 14.4 oz)     Exam    General: Alert and oriented x 3, NAD  Eyes:   HEENT:    Cardiovascular: S1 S2 auscultated, RRR. No pedal edema b/l  Respiratory: dec BS Left   Gastrointestinal: Soft, nontender, nondistended, + bowel sounds  Ext: no pedal edema bilaterally  Neuro: no new deficits  Musculoskeletal: No digital cyanosis, clubbing  Skin: No rashes  Psych: Normal affect and demeanor, alert and oriented x3    Data Reviewed:  I have personally reviewed following labs and imaging studies  Micro Results Recent Results (from the past 240 hour(s))  Culture, blood (routine x 2)     Status: None (Preliminary result)   Collection Time: 09/26/17  6:40 AM  Result Value Ref Range Status   Specimen Description BLOOD RIGHT ANTECUBITAL  Final   Special Requests   Final    BOTTLES DRAWN AEROBIC ONLY Blood Culture adequate volume   Culture   Final    NO GROWTH 2 DAYS Performed at [facility], [address] [place], [place] [address]    Report Status PENDING  Incomplete  Culture, blood (routine x 2)      Status: None (Preliminary result)   Collection Time: 09/26/17  6:47 AM  Result Value Ref Range Status   Specimen Description BLOOD LEFT HAND  Final   Special Requests   Final    BOTTLES DRAWN AEROBIC AND ANAEROBIC Blood  Culture adequate volume   Culture   Final    NO GROWTH 2 DAYS Performed at [facility], [place] [address] [place], [place] [address]    Report Status PENDING  Incomplete  Culture, body fluid-bottle     Status: Abnormal   Collection Time: 09/26/17  3:32 PM  Result Value Ref Range Status   Specimen Description PLEURAL LEFT  Final   Special Requests NONE  Final   Gram Stain   Final    GRAM POSITIVE COCCOBACILLUS IN BOTH AEROBIC AND ANAEROBIC BOTTLES CRITICAL RESULT CALLED TO, READ BACK BY AND VERIFIED WITH: R DUNN,RN AT 6767 09/27/17 BY L BENFIELD Performed at [facility], [place] [address], [place], [place] [address]    Culture (A)  Final    NON-GROUPABLE BETA STREPTOCOCCUS STAPHYLOCOCCUS AUREUS    Report Status 09/29/2017 FINAL  Final   Organism ID, Bacteria NON-GROUPABLE BETA STREPTOCOCCUS  Final   Organism ID, Bacteria STAPHYLOCOCCUS AUREUS  Final      Susceptibility   Staphylococcus aureus - MIC*    CIPROFLOXACIN <=0.5 SENSITIVE Sensitive     ERYTHROMYCIN >=8 RESISTANT Resistant     GENTAMICIN <=0.5 SENSITIVE Sensitive     OXACILLIN <=0.25 SENSITIVE Sensitive     TETRACYCLINE <=1 SENSITIVE Sensitive     VANCOMYCIN <=0.5 SENSITIVE Sensitive     TRIMETH/SULFA <=10 SENSITIVE Sensitive     CLINDAMYCIN <=0.25 SENSITIVE Sensitive     RIFAMPIN <=0.5 SENSITIVE Sensitive     Inducible Clindamycin NEGATIVE Sensitive     * STAPHYLOCOCCUS AUREUS   Non-groupable beta streptococcus - MIC*    PENICILLIN INTERMEDIATE Intermediate     CEFTRIAXONE 1 SENSITIVE Sensitive     ERYTHROMYCIN <=0.12 SENSITIVE Sensitive     LEVOFLOXACIN <=0.25 SENSITIVE Sensitive     VANCOMYCIN 0.5 SENSITIVE Sensitive     * NON-GROUPABLE BETA STREPTOCOCCUS  Gram stain     Status: None    Collection Time: 09/26/17  3:32 PM  Result Value Ref Range Status   Specimen Description PLEURAL LEFT  Final   Special Requests NONE  Final   Gram Stain   Final    WBC PRESENT, PREDOMINANTLY PMN NO ORGANISMS SEEN CYTOSPIN SMEAR Performed at [facility], [address] [place], [place] [address]    Report Status 09/26/2017 FINAL  Final    Radiology Reports Dg Chest Port 1 View  Result Date: 09/26/2017 CLINICAL DATA:  Large left pleural effusion. Status post thoracentesis. EXAM: PORTABLE CHEST 1 VIEW COMPARISON:  Prior today FINDINGS: Large left pleural effusion shows slight decrease in size since previous study. There is mildly improved aeration left upper lung field. Right lung remains clear. No pneumothorax visualized. IMPRESSION: Mild decrease in size of large left pleural effusion. No pneumothorax visualized. Electronically Signed   By: Earle Gell M.D.   On: 09/26/2017 16:27   Dg Chest Port 1 View  Result Date: 09/26/2017 CLINICAL DATA:  Acute onset of shortness of breath. EXAM: PORTABLE CHEST 1 VIEW COMPARISON:  Chest radiograph and CT of the chest performed earlier today at 12:07 a.m. and 12:33 a.m. FINDINGS: A very large left-sided pleural effusion is again noted, with underlying airspace opacification. Underlying vascular congestion is noted. No pneumothorax is seen. The cardiomediastinal silhouette is not well characterized due to the adjacent pleural effusion. No acute osseous abnormalities are identified. IMPRESSION: Very large left-sided pleural effusion again noted, with underlying airspace opacification. Vascular congestion seen. Electronically Signed   By: Francoise Schaumann.D.  On: 09/26/2017 06:11    Lab Data:  CBC: Recent Labs  Lab 09/26/17 0633 09/27/17 0723 09/29/17 0611  WBC 14.5* 13.5* 17.5*  NEUTROABS 11.7*  --   --   HGB 9.4* 9.5* 9.1*  HCT 32.9* 33.7* 32.2*  MCV 78.5 78.9 78.7  PLT 358 396 956*   Basic Metabolic Panel: Recent Labs  Lab  09/26/17 0633 09/26/17 0831 09/27/17 0723 09/29/17 0611  NA 134* 135 134* 138  K 3.3* 4.0 3.7 3.7  CL 97* 98* 99* 99*  CO2 27 26 25 29   GLUCOSE 148* 120* 120* 117*  BUN 9 9 12 8   CREATININE 0.86 0.81 0.76 0.63  CALCIUM 8.1* 8.1* 8.0* 8.3*   GFR: Estimated Creatinine Clearance: 138.2 mL/min (by C-G formula based on SCr of 0.63 mg/dL). Liver Function Tests: Recent Labs  Lab 09/26/17 0633 09/26/17 0831  AST 18  --   ALT 23  --   ALKPHOS 55  --   BILITOT 0.6  --   PROT 6.6 6.2*  ALBUMIN 2.2*  --    No results for input(s): LIPASE, AMYLASE in the last 168 hours. No results for input(s): AMMONIA in the last 168 hours. Coagulation Profile: No results for input(s): INR, PROTIME in the last 168 hours. Cardiac Enzymes: No results for input(s): CKTOTAL, CKMB, CKMBINDEX, TROPONINI in the last 168 hours. BNP (last 3 results) No results for input(s): PROBNP in the last 8760 hours. HbA1C: No results for input(s): HGBA1C in the last 72 hours. CBG: Recent Labs  Lab 09/27/17 1634 09/27/17 2316 09/28/17 0733 09/28/17 1117 09/28/17 2353  GLUCAP 145* 131* 127* 134* 139*   Lipid Profile: No results for input(s): CHOL, HDL, LDLCALC, TRIG, CHOLHDL, LDLDIRECT in the last 72 hours. Thyroid Function Tests: No results for input(s): TSH, T4TOTAL, FREET4, T3FREE, THYROIDAB in the last 72 hours. Anemia Panel: No results for input(s): VITAMINB12, FOLATE, FERRITIN, TIBC, IRON, RETICCTPCT in the last 72 hours. Urine analysis:    Component Value Date/Time   COLORURINE YELLOW 12/04/2011 0315   APPEARANCEUR CLEAR 12/04/2011 0315   LABSPEC 1.011 12/04/2011 0315   PHURINE 6.0 12/04/2011 0315   GLUCOSEU NEGATIVE 12/04/2011 0315   HGBUR NEGATIVE 12/04/2011 0315   BILIRUBINUR NEGATIVE 12/04/2011 0315   KETONESUR NEGATIVE 12/04/2011 0315   PROTEINUR NEGATIVE 12/04/2011 0315   UROBILINOGEN 0.2 12/04/2011 0315   NITRITE NEGATIVE 12/04/2011 0315   LEUKOCYTESUR NEGATIVE 12/04/2011 0315      Keyandre Pileggi M.D. [facility] 09/29/2017, 10:10 AM  Pager: [phone] Between 7am to 7pm - call Pager - [phone]  After 7pm go to [email or web address] - password [email or web address]  Call night coverage person covering after 7pm

## 2017-09-29 NOTE — H&P (View-Only) (Signed)
Procedure(s) (LRB): VIDEO BRONCHOSCOPY (N/A) VIDEO ASSISTED THORACOSCOPY (Left) DRAINAGE OF PLEURAL EFFUSION (Left) Subjective: Still has some back pain. Breathing comfortably at rest  Objective: Vital signs in last 24 hours: Temp:  [97.7 F (36.5 C)-99.7 F (37.6 C)] 99.7 F (37.6 C) (06/11 1533) Pulse Rate:  [108-131] 129 (06/11 1533) Cardiac Rhythm: Sinus tachycardia (06/11 0700) Resp:  [16-20] 18 (06/11 1533) BP: (118-136)/(65-90) 134/82 (06/11 1533) SpO2:  [94 %-95 %] 95 % (06/11 1533) Weight:  [222 lb 14.4 oz (101.1 kg)] 222 lb 14.4 oz (101.1 kg) (06/11 0425)  Hemodynamic parameters for last 24 hours:    Intake/Output from previous day: 06/10 0701 - 06/11 0700 In: 1710 [P.O.:960; IV Piggyback:750] Out: 1150 [Urine:1150] Intake/Output this shift: Total I/O In: 600 [P.O.:600] Out: 1350 [Urine:1350]  General appearance: alert, cooperative and no distress Heart: regular rate and rhythm Lungs: diminished breath sounds on left  Lab Results: Recent Labs    09/27/17 0723 09/29/17 0611  WBC 13.5* 17.5*  HGB 9.5* 9.1*  HCT 33.7* 32.2*  PLT 396 422*   BMET:  Recent Labs    09/27/17 0723 09/29/17 0611  NA 134* 138  K 3.7 3.7  CL 99* 99*  CO2 25 29  GLUCOSE 120* 117*  BUN 12 8  CREATININE 0.76 0.63  CALCIUM 8.0* 8.3*    PT/INR: No results for input(s): LABPROT, INR in the last 72 hours. ABG No results found for: PHART, HCO3, TCO2, ACIDBASEDEF, O2SAT CBG (last 3)  Recent Labs    09/28/17 0733 09/28/17 1117 09/28/17 2353  GLUCAP 127* 134* 139*    Assessment/Plan: S/P Procedure(s) (LRB): VIDEO BRONCHOSCOPY (N/A) VIDEO ASSISTED THORACOSCOPY (Left) DRAINAGE OF PLEURAL EFFUSION (Left) -Cultures growing MSSA and strep- antibiotics changed to ceftriaxone  Plan for bronch, left VATS, drainage of empyema tomorrow afternoon He is aware of the indications, risks and benefits and agrees to proceed    LOS: 3 days    Melrose Nakayama 09/29/2017

## 2017-09-29 NOTE — Progress Notes (Signed)
Procedure(s) (LRB): VIDEO BRONCHOSCOPY (N/A) VIDEO ASSISTED THORACOSCOPY (Left) DRAINAGE OF PLEURAL EFFUSION (Left) Subjective: Still has some back pain. Breathing comfortably at rest  Objective: Vital signs in last 24 hours: Temp:  [97.7 F (36.5 C)-99.7 F (37.6 C)] 99.7 F (37.6 C) (06/11 1533) Pulse Rate:  [108-131] 129 (06/11 1533) Cardiac Rhythm: Sinus tachycardia (06/11 0700) Resp:  [16-20] 18 (06/11 1533) BP: (118-136)/(65-90) 134/82 (06/11 1533) SpO2:  [94 %-95 %] 95 % (06/11 1533) Weight:  [222 lb 14.4 oz (101.1 kg)] 222 lb 14.4 oz (101.1 kg) (06/11 0425)  Hemodynamic parameters for last 24 hours:    Intake/Output from previous day: 06/10 0701 - 06/11 0700 In: 1710 [P.O.:960; IV Piggyback:750] Out: 1150 [Urine:1150] Intake/Output this shift: Total I/O In: 600 [P.O.:600] Out: 1350 [Urine:1350]  General appearance: alert, cooperative and no distress Heart: regular rate and rhythm Lungs: diminished breath sounds on left  Lab Results: Recent Labs    09/27/17 0723 09/29/17 0611  WBC 13.5* 17.5*  HGB 9.5* 9.1*  HCT 33.7* 32.2*  PLT 396 422*   BMET:  Recent Labs    09/27/17 0723 09/29/17 0611  NA 134* 138  K 3.7 3.7  CL 99* 99*  CO2 25 29  GLUCOSE 120* 117*  BUN 12 8  CREATININE 0.76 0.63  CALCIUM 8.0* 8.3*    PT/INR: No results for input(s): LABPROT, INR in the last 72 hours. ABG No results found for: PHART, HCO3, TCO2, ACIDBASEDEF, O2SAT CBG (last 3)  Recent Labs    09/28/17 0733 09/28/17 1117 09/28/17 2353  GLUCAP 127* 134* 139*    Assessment/Plan: S/P Procedure(s) (LRB): VIDEO BRONCHOSCOPY (N/A) VIDEO ASSISTED THORACOSCOPY (Left) DRAINAGE OF PLEURAL EFFUSION (Left) -Cultures growing MSSA and strep- antibiotics changed to ceftriaxone  Plan for bronch, left VATS, drainage of empyema tomorrow afternoon He is aware of the indications, risks and benefits and agrees to proceed    LOS: 3 days    Melrose Nakayama 09/29/2017

## 2017-09-29 NOTE — Care Management Important Message (Signed)
Important Message  Patient Details  Name: Jeremy Dillon MRN: [id number] Date of Birth: February 13, 1971   Medicare Important Message Given:  Yes    Nobuo Nunziata P Sherita Decoste 09/29/2017, 3:45 PM

## 2017-09-30 ENCOUNTER — Inpatient Hospital Stay (HOSPITAL_COMMUNITY): Payer: PPO | Admitting: Certified Registered Nurse Anesthetist

## 2017-09-30 ENCOUNTER — Encounter (HOSPITAL_COMMUNITY)
Admission: AD | Disposition: A | Discharge status: Home / Self Care | Payer: Self-pay | Source: Other Acute Inpatient Hospital | Attending: Thoracic Surgery (Cardiothoracic Vascular Surgery)

## 2017-09-30 ENCOUNTER — Encounter (HOSPITAL_COMMUNITY): Payer: Self-pay | Admitting: Certified Registered Nurse Anesthetist

## 2017-09-30 ENCOUNTER — Inpatient Hospital Stay (HOSPITAL_COMMUNITY): Payer: PPO

## 2017-09-30 DIAGNOSIS — Z9889 Other specified postprocedural states: Secondary | ICD-10-CM

## 2017-09-30 DIAGNOSIS — D509 Iron deficiency anemia, unspecified: Secondary | ICD-10-CM

## 2017-09-30 HISTORY — PX: VIDEO BRONCHOSCOPY: SHX5072

## 2017-09-30 HISTORY — PX: VIDEO ASSISTED THORACOSCOPY (VATS)/DECORTICATION: SHX6171

## 2017-09-30 HISTORY — PX: PLEURAL EFFUSION DRAINAGE: SHX5099

## 2017-09-30 LAB — SURGICAL PCR SCREEN
MRSA, PCR: NEGATIVE
STAPHYLOCOCCUS AUREUS: POSITIVE — AB

## 2017-09-30 LAB — GLUCOSE, CAPILLARY
GLUCOSE-CAPILLARY: 103 mg/dL — AB (ref 65–99)
Glucose-Capillary: 119 mg/dL — ABNORMAL HIGH (ref 65–99)
Glucose-Capillary: 118 mg/dL — ABNORMAL HIGH (ref 65–99)

## 2017-09-30 LAB — POCT I-STAT 7, (LYTES, BLD GAS, ICA,H+H)
ACID-BASE EXCESS: 3 mmol/L — AB (ref 0.0–2.0)
Acid-Base Excess: 3 mmol/L — ABNORMAL HIGH (ref 0.0–2.0)
Acid-Base Excess: 3 mmol/L — ABNORMAL HIGH (ref 0.0–2.0)
BICARBONATE: 30 mmol/L — AB (ref 20.0–28.0)
BICARBONATE: 29.2 mmol/L — AB (ref 20.0–28.0)
BICARBONATE: 29.4 mmol/L — AB (ref 20.0–28.0)
CALCIUM ION: 1.08 mmol/L — AB (ref 1.15–1.40)
CALCIUM ION: 1.03 mmol/L — AB (ref 1.15–1.40)
Calcium, Ion: 1.12 mmol/L — ABNORMAL LOW (ref 1.15–1.40)
HCT: 25 % — ABNORMAL LOW (ref 39.0–52.0)
HEMATOCRIT: 23 % — AB (ref 39.0–52.0)
HEMATOCRIT: 24 % — AB (ref 39.0–52.0)
HEMOGLOBIN: 8.5 g/dL — AB (ref 13.0–17.0)
Hemoglobin: 7.8 g/dL — ABNORMAL LOW (ref 13.0–17.0)
Hemoglobin: 8.2 g/dL — ABNORMAL LOW (ref 13.0–17.0)
O2 SAT: 100 %
O2 Saturation: 96 %
O2 Saturation: 96 %
PCO2 ART: 60.5 mmHg — AB (ref 32.0–48.0)
PCO2 ART: 53.2 mmHg — AB (ref 32.0–48.0)
PH ART: 7.358 (ref 7.350–7.450)
PO2 ART: 94 mmHg (ref 83.0–108.0)
POTASSIUM: 4.7 mmol/L (ref 3.5–5.1)
POTASSIUM: 4.9 mmol/L (ref 3.5–5.1)
Patient temperature: 36.3
Patient temperature: 36.5
Potassium: 4.3 mmol/L (ref 3.5–5.1)
SODIUM: 140 mmol/L (ref 135–145)
Sodium: 140 mmol/L (ref 135–145)
Sodium: 139 mmol/L (ref 135–145)
TCO2: 32 mmol/L (ref 22–32)
TCO2: 31 mmol/L (ref 22–32)
TCO2: 31 mmol/L (ref 22–32)
pCO2 arterial: 51.6 mmHg — ABNORMAL HIGH (ref 32.0–48.0)
pH, Arterial: 7.303 — ABNORMAL LOW (ref 7.350–7.450)
pH, Arterial: 7.348 — ABNORMAL LOW (ref 7.350–7.450)
pO2, Arterial: 82 mmHg — ABNORMAL LOW (ref 83.0–108.0)
pO2, Arterial: 340 mmHg — ABNORMAL HIGH (ref 83.0–108.0)

## 2017-09-30 LAB — CBC
HCT: 33.9 % — ABNORMAL LOW (ref 39.0–52.0)
HEMATOCRIT: 32.2 % — AB (ref 39.0–52.0)
HEMOGLOBIN: 9.4 g/dL — AB (ref 13.0–17.0)
HEMOGLOBIN: 8.9 g/dL — AB (ref 13.0–17.0)
MCH: 22 pg — AB (ref 26.0–34.0)
MCH: 22 pg — AB (ref 26.0–34.0)
MCHC: 27.7 g/dL — AB (ref 30.0–36.0)
MCHC: 27.6 g/dL — AB (ref 30.0–36.0)
MCV: 79.2 fL (ref 78.0–100.0)
MCV: 79.7 fL (ref 78.0–100.0)
Platelets: 444 10*3/uL — ABNORMAL HIGH (ref 150–400)
Platelets: 413 10*3/uL — ABNORMAL HIGH (ref 150–400)
RBC: 4.28 MIL/uL (ref 4.22–5.81)
RBC: 4.04 MIL/uL — ABNORMAL LOW (ref 4.22–5.81)
RDW: 22.2 % — ABNORMAL HIGH (ref 11.5–15.5)
RDW: 22.5 % — ABNORMAL HIGH (ref 11.5–15.5)
WBC: 19 10*3/uL — ABNORMAL HIGH (ref 4.0–10.5)
WBC: 17.8 10*3/uL — ABNORMAL HIGH (ref 4.0–10.5)

## 2017-09-30 LAB — BLOOD GAS, ARTERIAL
ACID-BASE EXCESS: 4.9 mmol/L — AB (ref 0.0–2.0)
BICARBONATE: 28.8 mmol/L — AB (ref 20.0–28.0)
DRAWN BY: 535271
FIO2: 21
O2 Saturation: 85 %
Patient temperature: 98.6
pCO2 arterial: 42.8 mmHg (ref 32.0–48.0)
pH, Arterial: 7.444 (ref 7.350–7.450)
pO2, Arterial: 52.5 mmHg — ABNORMAL LOW (ref 83.0–108.0)

## 2017-09-30 LAB — BASIC METABOLIC PANEL
Anion gap: 9 (ref 5–15)
BUN: 10 mg/dL (ref 6–20)
CHLORIDE: 104 mmol/L (ref 101–111)
CO2: 25 mmol/L (ref 22–32)
Calcium: 8.2 mg/dL — ABNORMAL LOW (ref 8.9–10.3)
Creatinine, Ser: 0.76 mg/dL (ref 0.61–1.24)
GFR calc Af Amer: >60 mL/min (ref >60)
GFR calc non Af Amer: >60 mL/min (ref >60)
Glucose, Bld: 127 mg/dL — ABNORMAL HIGH (ref 65–99)
Potassium: 4.4 mmol/L (ref 3.5–5.1)
Sodium: 138 mmol/L (ref 135–145)

## 2017-09-30 LAB — COMPREHENSIVE METABOLIC PANEL
ALK PHOS: 59 U/L (ref 38–126)
ALT: 45 U/L (ref 17–63)
ANION GAP: 9 (ref 5–15)
AST: 33 U/L (ref 15–41)
Albumin: 1.9 g/dL — ABNORMAL LOW (ref 3.5–5.0)
BILIRUBIN TOTAL: 0.4 mg/dL (ref 0.3–1.2)
BUN: 9 mg/dL (ref 6–20)
CALCIUM: 8.3 mg/dL — AB (ref 8.9–10.3)
CO2: 30 mmol/L (ref 22–32)
Chloride: 102 mmol/L (ref 101–111)
Creatinine, Ser: 0.75 mg/dL (ref 0.61–1.24)
GFR calc non Af Amer: >60 mL/min (ref >60)
Glucose, Bld: 132 mg/dL — ABNORMAL HIGH (ref 65–99)
Potassium: 4.2 mmol/L (ref 3.5–5.1)
SODIUM: 141 mmol/L (ref 135–145)
TOTAL PROTEIN: 5.9 g/dL — AB (ref 6.5–8.1)

## 2017-09-30 LAB — APTT: aPTT: 41 seconds — ABNORMAL HIGH (ref 24–36)

## 2017-09-30 LAB — PREPARE RBC (CROSSMATCH)

## 2017-09-30 LAB — PROTIME-INR
INR: 1.4
PROTHROMBIN TIME: 17.1 s — AB (ref 11.4–15.2)

## 2017-09-30 LAB — ABO/RH: ABO/RH(D): O POS

## 2017-09-30 SURGERY — BRONCHOSCOPY, VIDEO-ASSISTED
Anesthesia: General | Site: Chest

## 2017-09-30 MED ORDER — ESMOLOL HCL 100 MG/10ML IV SOLN
INTRAVENOUS | Status: AC
  Filled 2017-09-30: qty 10

## 2017-09-30 MED ORDER — METOPROLOL TARTRATE 25 MG PO TABS
25.0000 mg | ORAL_TABLET | Freq: Two times a day (BID) | ORAL | Status: DC
  Administered 2017-10-01 – 2017-10-06 (×11): 25 mg via ORAL
  Filled 2017-09-30 (×11): qty 1

## 2017-09-30 MED ORDER — INSULIN ASPART 100 UNIT/ML ~~LOC~~ SOLN
0.0000 [IU] | Freq: Four times a day (QID) | SUBCUTANEOUS | Status: DC
  Administered 2017-10-01: 12 [IU] via SUBCUTANEOUS
  Administered 2017-10-01: 4 [IU] via SUBCUTANEOUS

## 2017-09-30 MED ORDER — ACETAMINOPHEN 160 MG/5ML PO SOLN: 1000.0000 mg | Freq: Four times a day (QID) | ORAL | Status: AC

## 2017-09-30 MED ORDER — PHENYLEPHRINE HCL 10 MG/ML IJ SOLN
INTRAMUSCULAR | Status: DC | PRN
  Administered 2017-09-30: 15 ug/min via INTRAVENOUS

## 2017-09-30 MED ORDER — MIDAZOLAM HCL 2 MG/2ML IJ SOLN
INTRAMUSCULAR | Status: AC
  Filled 2017-09-30: qty 2

## 2017-09-30 MED ORDER — ONDANSETRON HCL 4 MG/2ML IJ SOLN
INTRAMUSCULAR | Status: AC
  Filled 2017-09-30: qty 2

## 2017-09-30 MED ORDER — LIDOCAINE 2% (20 MG/ML) 5 ML SYRINGE
INTRAMUSCULAR | Status: DC | PRN
  Administered 2017-09-30: 80 mg via INTRAVENOUS

## 2017-09-30 MED ORDER — DIPHENHYDRAMINE HCL 50 MG/ML IJ SOLN: 12.5000 mg | Freq: Four times a day (QID) | INTRAMUSCULAR | Status: DC | PRN

## 2017-09-30 MED ORDER — MUPIROCIN 2 % EX OINT
1.0000 "application " | TOPICAL_OINTMENT | Freq: Two times a day (BID) | CUTANEOUS | Status: AC
  Administered 2017-09-30 – 2017-10-02 (×4): 1 via NASAL
  Filled 2017-09-30 (×3): qty 22

## 2017-09-30 MED ORDER — SUGAMMADEX SODIUM 200 MG/2ML IV SOLN
INTRAVENOUS | Status: DC | PRN
  Administered 2017-09-30: 200 mg via INTRAVENOUS

## 2017-09-30 MED ORDER — OXYCODONE HCL 5 MG PO TABS: 5.0000 mg | ORAL_TABLET | Freq: Once | ORAL | Status: DC | PRN

## 2017-09-30 MED ORDER — MIDAZOLAM HCL 2 MG/2ML IJ SOLN
INTRAMUSCULAR | Status: DC | PRN
  Administered 2017-09-30 (×2): 1 mg via INTRAVENOUS

## 2017-09-30 MED ORDER — ONDANSETRON HCL 4 MG/2ML IJ SOLN
INTRAMUSCULAR | Status: DC | PRN
  Administered 2017-09-30: 4 mg via INTRAVENOUS

## 2017-09-30 MED ORDER — ALBUMIN HUMAN 5 % IV SOLN
INTRAVENOUS | Status: DC | PRN
  Administered 2017-09-30 (×2): via INTRAVENOUS

## 2017-09-30 MED ORDER — ROCURONIUM BROMIDE 10 MG/ML (PF) SYRINGE
PREFILLED_SYRINGE | INTRAVENOUS | Status: AC
  Filled 2017-09-30: qty 5

## 2017-09-30 MED ORDER — SODIUM CHLORIDE 0.9 % IV SOLN
1500.0000 mg | INTRAVENOUS | Status: AC
  Administered 2017-09-30: 1500 mg via INTRAVENOUS
  Filled 2017-09-30: qty 1500

## 2017-09-30 MED ORDER — FENTANYL CITRATE (PF) 250 MCG/5ML IJ SOLN
INTRAMUSCULAR | Status: AC
  Filled 2017-09-30: qty 5

## 2017-09-30 MED ORDER — PHENYLEPHRINE 40 MCG/ML (10ML) SYRINGE FOR IV PUSH (FOR BLOOD PRESSURE SUPPORT): PREFILLED_SYRINGE | INTRAVENOUS | Status: DC | PRN

## 2017-09-30 MED ORDER — ONDANSETRON HCL 4 MG/2ML IJ SOLN
INTRAMUSCULAR | Status: AC
Start: 2017-09-30 — End: ?
  Filled 2017-09-30: qty 2

## 2017-09-30 MED ORDER — LACTATED RINGERS IV SOLN: INTRAVENOUS | Status: DC

## 2017-09-30 MED ORDER — TRAMADOL HCL 50 MG PO TABS
50.0000 mg | ORAL_TABLET | Freq: Four times a day (QID) | ORAL | Status: DC | PRN
  Filled 2017-09-30: qty 2

## 2017-09-30 MED ORDER — BUPIVACAINE LIPOSOME 1.3 % IJ SUSP
20.0000 mL | INTRAMUSCULAR | Status: AC
  Administered 2017-09-30: 3 mL
  Filled 2017-09-30: qty 20
  Filled 2017-09-30: qty 10

## 2017-09-30 MED ORDER — LACTATED RINGERS IV SOLN
INTRAVENOUS | Status: DC | PRN
  Administered 2017-09-30 (×2): via INTRAVENOUS

## 2017-09-30 MED ORDER — BISACODYL 5 MG PO TBEC
10.0000 mg | DELAYED_RELEASE_TABLET | Freq: Every day | ORAL | Status: DC
  Administered 2017-10-01 – 2017-10-06 (×6): 10 mg via ORAL
  Filled 2017-09-30 (×6): qty 2

## 2017-09-30 MED ORDER — LACTATED RINGERS IV SOLN
INTRAVENOUS | Status: DC | PRN
  Administered 2017-09-30: 14:00:00 via INTRAVENOUS

## 2017-09-30 MED ORDER — PROPOFOL 10 MG/ML IV BOLUS
INTRAVENOUS | Status: DC | PRN
  Administered 2017-09-30 (×2): 50 mg via INTRAVENOUS

## 2017-09-30 MED ORDER — ONDANSETRON HCL 4 MG/2ML IJ SOLN: 4.0000 mg | Freq: Four times a day (QID) | INTRAMUSCULAR | Status: DC | PRN

## 2017-09-30 MED ORDER — DIPHENHYDRAMINE HCL 12.5 MG/5ML PO ELIX
12.5000 mg | ORAL_SOLUTION | Freq: Four times a day (QID) | ORAL | Status: DC | PRN
Start: 2017-09-30 — End: 2017-10-06
  Filled 2017-09-30: qty 5

## 2017-09-30 MED ORDER — 0.9 % SODIUM CHLORIDE (POUR BTL) OPTIME
TOPICAL | Status: DC | PRN
  Administered 2017-09-30: 6000 mL

## 2017-09-30 MED ORDER — METRONIDAZOLE IN NACL 5-0.79 MG/ML-% IV SOLN
500.0000 mg | Freq: Three times a day (TID) | INTRAVENOUS | Status: DC
Start: 2017-09-30 — End: 2017-10-04
  Administered 2017-09-30 – 2017-10-04 (×11): 500 mg via INTRAVENOUS
  Filled 2017-09-30 (×12): qty 100

## 2017-09-30 MED ORDER — NALOXONE HCL 0.4 MG/ML IJ SOLN: 0.4000 mg | INTRAMUSCULAR | Status: DC | PRN

## 2017-09-30 MED ORDER — HYDROMORPHONE HCL 2 MG/ML IJ SOLN
0.3000 mg | INTRAMUSCULAR | Status: DC | PRN
  Administered 2017-09-30 (×2): 0.5 mg via INTRAVENOUS

## 2017-09-30 MED ORDER — SENNOSIDES-DOCUSATE SODIUM 8.6-50 MG PO TABS
1.0000 | ORAL_TABLET | Freq: Every day | ORAL | Status: DC
  Administered 2017-09-30 – 2017-10-05 (×6): 1 via ORAL
  Filled 2017-09-30 (×6): qty 1

## 2017-09-30 MED ORDER — POTASSIUM CHLORIDE 10 MEQ/50ML IV SOLN: 10.0000 meq | Freq: Every day | INTRAVENOUS | Status: DC | PRN

## 2017-09-30 MED ORDER — VANCOMYCIN HCL 10 G IV SOLR
1250.0000 mg | Freq: Three times a day (TID) | INTRAVENOUS | Status: DC
  Administered 2017-10-01 – 2017-10-02 (×6): 1250 mg via INTRAVENOUS
  Filled 2017-09-30 (×10): qty 1250

## 2017-09-30 MED ORDER — SUGAMMADEX SODIUM 200 MG/2ML IV SOLN
INTRAVENOUS | Status: AC
  Filled 2017-09-30: qty 2

## 2017-09-30 MED ORDER — DEXAMETHASONE SODIUM PHOSPHATE 10 MG/ML IJ SOLN
INTRAMUSCULAR | Status: DC | PRN
  Administered 2017-09-30: 4 mg via INTRAVENOUS

## 2017-09-30 MED ORDER — ESMOLOL HCL 100 MG/10ML IV SOLN
INTRAVENOUS | Status: DC | PRN
  Administered 2017-09-30: 30 mg via INTRAVENOUS

## 2017-09-30 MED ORDER — FENTANYL CITRATE (PF) 250 MCG/5ML IJ SOLN
INTRAMUSCULAR | Status: DC | PRN
  Administered 2017-09-30: 50 ug via INTRAVENOUS
  Administered 2017-09-30: 150 ug via INTRAVENOUS
  Administered 2017-09-30 (×3): 50 ug via INTRAVENOUS

## 2017-09-30 MED ORDER — ROCURONIUM BROMIDE 10 MG/ML (PF) SYRINGE
PREFILLED_SYRINGE | INTRAVENOUS | Status: DC | PRN
  Administered 2017-09-30: 20 mg via INTRAVENOUS
  Administered 2017-09-30: 50 mg via INTRAVENOUS
  Administered 2017-09-30 (×4): 20 mg via INTRAVENOUS
  Administered 2017-09-30: 50 mg via INTRAVENOUS

## 2017-09-30 MED ORDER — FENTANYL 40 MCG/ML IV SOLN
INTRAVENOUS | Status: DC
  Administered 2017-09-30: 20:00:00 via INTRAVENOUS
  Administered 2017-09-30: 90 ug via INTRAVENOUS
  Administered 2017-10-01: 30 ug via INTRAVENOUS
  Administered 2017-10-01: 75 ug via INTRAVENOUS
  Administered 2017-10-01: 1000 ug via INTRAVENOUS
  Administered 2017-10-01: 225 ug via INTRAVENOUS
  Administered 2017-10-01: 255 ug via INTRAVENOUS
  Administered 2017-10-01: 120 ug via INTRAVENOUS
  Administered 2017-10-02: 225 ug via INTRAVENOUS
  Administered 2017-10-02: 180 ug via INTRAVENOUS
  Administered 2017-10-02 (×2): 90 ug via INTRAVENOUS
  Administered 2017-10-02: 15 ug via INTRAVENOUS
  Administered 2017-10-02: 1000 ug via INTRAVENOUS
  Administered 2017-10-02: 180 ug via INTRAVENOUS
  Administered 2017-10-03: 45 ug via INTRAVENOUS
  Administered 2017-10-03: 135 ug via INTRAVENOUS
  Administered 2017-10-03: 150 ug via INTRAVENOUS
  Administered 2017-10-03: 90 ug via INTRAVENOUS
  Administered 2017-10-03: 318 ug via INTRAVENOUS
  Administered 2017-10-04: 90 ug via INTRAVENOUS
  Administered 2017-10-04: 255 ug via INTRAVENOUS
  Administered 2017-10-04: 15 ug via INTRAVENOUS
  Administered 2017-10-04: 90 ug via INTRAVENOUS
  Administered 2017-10-04: 30 ug via INTRAVENOUS
  Administered 2017-10-04: 1000 ug via INTRAVENOUS
  Administered 2017-10-05: 180 ug via INTRAVENOUS
  Administered 2017-10-05: 45 ug via INTRAVENOUS
  Filled 2017-09-30 (×4): qty 25

## 2017-09-30 MED ORDER — LIDOCAINE 2% (20 MG/ML) 5 ML SYRINGE
INTRAMUSCULAR | Status: AC
  Filled 2017-09-30: qty 5

## 2017-09-30 MED ORDER — DEXAMETHASONE SODIUM PHOSPHATE 10 MG/ML IJ SOLN
INTRAMUSCULAR | Status: AC
  Filled 2017-09-30: qty 1

## 2017-09-30 MED ORDER — PROPOFOL 10 MG/ML IV BOLUS
INTRAVENOUS | Status: AC
  Filled 2017-09-30: qty 20

## 2017-09-30 MED ORDER — SODIUM CHLORIDE 0.9% FLUSH
9.0000 mL | INTRAVENOUS | Status: DC | PRN
  Administered 2017-10-02: 9 mL via INTRAVENOUS
  Filled 2017-09-30: qty 9

## 2017-09-30 MED ORDER — VANCOMYCIN HCL 10 G IV SOLR
2000.0000 mg | Freq: Once | INTRAVENOUS | Status: AC
  Administered 2017-10-01: 2000 mg via INTRAVENOUS
  Filled 2017-09-30 (×2): qty 2000

## 2017-09-30 MED ORDER — OXYCODONE HCL 5 MG PO TABS
5.0000 mg | ORAL_TABLET | ORAL | Status: DC | PRN
  Administered 2017-10-04 (×3): 10 mg via ORAL
  Administered 2017-10-05: 5 mg via ORAL
  Administered 2017-10-05 – 2017-10-06 (×2): 10 mg via ORAL
  Filled 2017-09-30 (×3): qty 2
  Filled 2017-09-30: qty 1
  Filled 2017-09-30 (×2): qty 2
  Filled 2017-09-30: qty 1

## 2017-09-30 MED ORDER — HYDROMORPHONE HCL 2 MG/ML IJ SOLN
INTRAMUSCULAR | Status: AC
  Filled 2017-09-30: qty 1

## 2017-09-30 MED ORDER — PROMETHAZINE HCL 25 MG/ML IJ SOLN: 6.2500 mg | INTRAMUSCULAR | Status: DC | PRN

## 2017-09-30 MED ORDER — KCL IN DEXTROSE-NACL 10-5-0.45 MEQ/L-%-% IV SOLN
INTRAVENOUS | Status: AC
  Administered 2017-09-30 – 2017-10-01 (×2): via INTRAVENOUS
  Filled 2017-09-30 (×3): qty 1000

## 2017-09-30 MED ORDER — KETOROLAC TROMETHAMINE 30 MG/ML IJ SOLN
INTRAMUSCULAR | Status: AC
  Administered 2017-09-30: 30 mg
  Filled 2017-09-30: qty 1

## 2017-09-30 MED ORDER — KETOROLAC TROMETHAMINE 15 MG/ML IJ SOLN: 30.0000 mg | Freq: Once | INTRAMUSCULAR | Status: DC

## 2017-09-30 MED ORDER — OXYCODONE HCL 5 MG/5ML PO SOLN: 5.0000 mg | Freq: Once | ORAL | Status: DC | PRN

## 2017-09-30 MED ORDER — ACETAMINOPHEN 500 MG PO TABS
1000.0000 mg | ORAL_TABLET | Freq: Four times a day (QID) | ORAL | Status: AC
  Administered 2017-09-30 – 2017-10-05 (×18): 1000 mg via ORAL
  Filled 2017-09-30 (×19): qty 2

## 2017-09-30 SURGICAL SUPPLY — 104 items
ADH SKN CLS APL DERMABOND .7 (GAUZE/BANDAGES/DRESSINGS)
APPLIER CLIP ROT 10 11.4 M/L (STAPLE)
APR CLP MED LRG 11.4X10 (STAPLE)
BAG SPEC RTRVL LRG 6X4 10 (ENDOMECHANICALS)
BRUSH CYTOL CELLEBRITY 1.5X140 (MISCELLANEOUS) IMPLANT
CANISTER SUCT 3000ML PPV (MISCELLANEOUS) ×5 IMPLANT
CATH KIT ON Q 5IN SLV (PAIN MANAGEMENT) IMPLANT
CATH THORACIC 28FR (CATHETERS) IMPLANT
CATH THORACIC 28FR RT ANG (CATHETERS) IMPLANT
CATH THORACIC 36FR (CATHETERS) IMPLANT
CATH THORACIC 36FR RT ANG (CATHETERS) IMPLANT
CLIP APPLIE ROT 10 11.4 M/L (STAPLE) IMPLANT
CLIP VESOCCLUDE MED 6/CT (CLIP) IMPLANT
CONN ST 1/4X3/8  BEN (MISCELLANEOUS) ×4
CONN ST 1/4X3/8 BEN (MISCELLANEOUS) IMPLANT
CONN Y 3/8X3/8X3/8  BEN (MISCELLANEOUS) ×4
CONN Y 3/8X3/8X3/8 BEN (MISCELLANEOUS) ×3 IMPLANT
CONT SPEC 4OZ CLIKSEAL STRL BL (MISCELLANEOUS) ×12 IMPLANT
COVER BACK TABLE 60X90IN (DRAPES) ×5 IMPLANT
COVER SURGICAL LIGHT HANDLE (MISCELLANEOUS) ×5 IMPLANT
DERMABOND ADVANCED (GAUZE/BANDAGES/DRESSINGS)
DERMABOND ADVANCED .7 DNX12 (GAUZE/BANDAGES/DRESSINGS) IMPLANT
DRAIN CHANNEL 28F RND 3/8 FF (WOUND CARE) IMPLANT
DRAIN CHANNEL 32F RND 10.7 FF (WOUND CARE) ×6 IMPLANT
DRAPE LAPAROSCOPIC ABDOMINAL (DRAPES) ×5 IMPLANT
DRAPE SLUSH/WARMER DISC (DRAPES) ×3 IMPLANT
ELECT BLADE 6.5 EXT (BLADE) ×2 IMPLANT
ELECT REM PT RETURN 9FT ADLT (ELECTROSURGICAL) ×5
ELECTRODE REM PT RTRN 9FT ADLT (ELECTROSURGICAL) ×3 IMPLANT
FILTER STRAW FLUID ASPIR (MISCELLANEOUS) IMPLANT
FORCEPS BIOP RJ4 1.8 (CUTTING FORCEPS) IMPLANT
FORCEPS RADIAL JAW LRG 4 PULM (INSTRUMENTS) IMPLANT
GAUZE SPONGE 4X4 12PLY STRL (GAUZE/BANDAGES/DRESSINGS) ×5 IMPLANT
GAUZE SPONGE 4X4 12PLY STRL LF (GAUZE/BANDAGES/DRESSINGS) ×2 IMPLANT
GLOVE BIO SURGEON STRL SZ 6.5 (GLOVE) ×3 IMPLANT
GLOVE BIO SURGEONS STRL SZ 6.5 (GLOVE) ×3
GLOVE BIOGEL PI IND STRL 6.5 (GLOVE) IMPLANT
GLOVE BIOGEL PI INDICATOR 6.5 (GLOVE) ×6
GLOVE SURG SIGNA 7.5 PF LTX (GLOVE) ×10 IMPLANT
GOWN STRL REUS W/ TWL LRG LVL3 (GOWN DISPOSABLE) ×6 IMPLANT
GOWN STRL REUS W/ TWL XL LVL3 (GOWN DISPOSABLE) ×3 IMPLANT
GOWN STRL REUS W/TWL LRG LVL3 (GOWN DISPOSABLE) ×15
GOWN STRL REUS W/TWL XL LVL3 (GOWN DISPOSABLE) ×10
HEMOSTAT SURGICEL 2X14 (HEMOSTASIS) IMPLANT
IV CATH 22GX1 FEP (IV SOLUTION) IMPLANT
KIT BASIN OR (CUSTOM PROCEDURE TRAY) ×5 IMPLANT
KIT CLEAN ENDO COMPLIANCE (KITS) ×5 IMPLANT
KIT SUCTION CATH 14FR (SUCTIONS) ×3 IMPLANT
KIT TURNOVER KIT B (KITS) ×5 IMPLANT
MARKER SKIN DUAL TIP RULER LAB (MISCELLANEOUS) ×3 IMPLANT
NDL HYPO 25GX1X1/2 BEV (NEEDLE) IMPLANT
NEEDLE HYPO 25GX1X1/2 BEV (NEEDLE) ×5 IMPLANT
NS IRRIG 1000ML POUR BTL (IV SOLUTION) ×14 IMPLANT
OIL SILICONE PENTAX (PARTS (SERVICE/REPAIRS)) ×5 IMPLANT
PACK CHEST (CUSTOM PROCEDURE TRAY) ×5 IMPLANT
PAD ARMBOARD 7.5X6 YLW CONV (MISCELLANEOUS) ×10 IMPLANT
POUCH ENDO CATCH II 15MM (MISCELLANEOUS) IMPLANT
POUCH SPECIMEN RETRIEVAL 10MM (ENDOMECHANICALS) IMPLANT
RADIAL JAW LRG 4 PULMONARY (INSTRUMENTS)
RELOAD STAPLE 60 3.8 GOLD REG (STAPLE) IMPLANT
RELOAD STAPLER GOLD 60MM (STAPLE) ×12 IMPLANT
SEALANT PROGEL (MISCELLANEOUS) IMPLANT
SEALANT SURG COSEAL 4ML (VASCULAR PRODUCTS) IMPLANT
SEALANT SURG COSEAL 8ML (VASCULAR PRODUCTS) IMPLANT
SOLUTION ANTI FOG 6CC (MISCELLANEOUS) ×5 IMPLANT
SPECIMEN JAR MEDIUM (MISCELLANEOUS) ×3 IMPLANT
SPONGE INTESTINAL PEANUT (DISPOSABLE) ×10 IMPLANT
SPONGE LAP 18X18 X RAY DECT (DISPOSABLE) ×6 IMPLANT
SPONGE TONSIL 1 RF SGL (DISPOSABLE) ×3 IMPLANT
STAPLE ECHEON FLEX 60 POW ENDO (STAPLE) ×2 IMPLANT
STAPLER RELOAD GOLD 60MM (STAPLE) ×20
SUT PROLENE 4 0 RB 1 (SUTURE) ×10
SUT PROLENE 4-0 RB1 .5 CRCL 36 (SUTURE) IMPLANT
SUT SILK  1 MH (SUTURE) ×6
SUT SILK 1 MH (SUTURE) ×6 IMPLANT
SUT SILK 2 0 SH (SUTURE) ×2 IMPLANT
SUT SILK 2 0SH CR/8 30 (SUTURE) IMPLANT
SUT SILK 3 0SH CR/8 30 (SUTURE) IMPLANT
SUT VIC AB 1 CTX 36 (SUTURE) ×5
SUT VIC AB 1 CTX36XBRD ANBCTR (SUTURE) IMPLANT
SUT VIC AB 2-0 CTX 36 (SUTURE) ×2 IMPLANT
SUT VIC AB 2-0 UR6 27 (SUTURE) IMPLANT
SUT VIC AB 3-0 MH 27 (SUTURE) IMPLANT
SUT VIC AB 3-0 X1 27 (SUTURE) ×5 IMPLANT
SUT VICRYL 2 TP 1 (SUTURE) IMPLANT
SYR 20CC LL (SYRINGE) IMPLANT
SYR 20ML ECCENTRIC (SYRINGE) ×8 IMPLANT
SYR 5ML LL (SYRINGE) ×3 IMPLANT
SYR 5ML LUER SLIP (SYRINGE) ×5 IMPLANT
SYR CONTROL 10ML LL (SYRINGE) ×2 IMPLANT
SYSTEM SAHARA CHEST DRAIN ATS (WOUND CARE) ×5 IMPLANT
TAPE CLOTH 4X10 WHT NS (GAUZE/BANDAGES/DRESSINGS) ×5 IMPLANT
TAPE CLOTH SURG 4X10 WHT LF (GAUZE/BANDAGES/DRESSINGS) ×2 IMPLANT
TIP APPLICATOR SPRAY EXTEND 16 (VASCULAR PRODUCTS) IMPLANT
TOWEL GREEN STERILE (TOWEL DISPOSABLE) ×5 IMPLANT
TOWEL GREEN STERILE FF (TOWEL DISPOSABLE) ×5 IMPLANT
TRAP SPECIMEN MUCOUS 40CC (MISCELLANEOUS) ×9 IMPLANT
TRAY FOLEY MTR SLVR 16FR STAT (SET/KITS/TRAYS/PACK) ×5 IMPLANT
TROCAR XCEL BLADELESS 5X75MML (TROCAR) ×5 IMPLANT
TROCAR XCEL NON-BLD 5MMX100MML (ENDOMECHANICALS) ×2 IMPLANT
TUBE CONNECTING 20'X1/4 (TUBING) ×1
TUBE CONNECTING 20X1/4 (TUBING) ×4 IMPLANT
VALVE DISPOSABLE (MISCELLANEOUS) ×5 IMPLANT
WATER STERILE IRR 1000ML POUR (IV SOLUTION) ×10 IMPLANT

## 2017-09-30 NOTE — Consult Note (Signed)
            [facility] CM Primary Care Navigator  09/30/2017  Jeremy Dillon 04-29-1970 [id number]   Went to see patient at the bedside to identify possible discharge needs but heis off the unit, in the OR at the moment.(bronchoscopy, drainage of pleural effusion and video assisted thoracoscopy- VATS)  Will attempt to see patient at another time when available in the room.    Addendum (10/01/17):  Went back to see patient at the bedside but patient was transferred from [place] [facility] to [facility] [facility] (cardiac ICU) after surgery.  Will attempt to see patient at another time when out of ICU.    For additional questions please contact:  Edwena Felty A. Loveda Colaizzi, BSN, RN-BC [facility] PRIMARY CARE Navigator Cell: [phone]

## 2017-09-30 NOTE — Anesthesia Procedure Notes (Signed)
Arterial Line Insertion Start/End6/03/2018 2:30 PM, 09/30/2017 2:40 PM Performed by: Murvin Natal, MD, anesthesiologist  Patient location: Pre-op. Preanesthetic checklist: patient identified, IV checked, site marked, risks and benefits discussed, surgical consent, monitors and equipment checked, pre-op evaluation, timeout performed and anesthesia consent Lidocaine 1% used for infiltration Right, radial was placed Catheter size: 20 Fr Hand hygiene performed , maximum sterile barriers used  and Seldinger technique used  Attempts: 1 Procedure performed using ultrasound guided technique. Following insertion, dressing applied and Biopatch. Post procedure assessment: normal and unchanged  Patient tolerated the procedure well with no immediate complications.

## 2017-09-30 NOTE — Progress Notes (Signed)
Paged covering NP Schorr to about patient's recent sinus brady 59 after being in 110's most of the night. Patient is asymptomatic. Will continue to monitor for patient safety.

## 2017-09-30 NOTE — Progress Notes (Signed)
Pharmacy Antibiotic Note  Jeremy Dillon is a 47 y.o. male transferred to [facility] from [facility] with pleural effusion.  Pt is s/p VATS today.    Pt is currently on Rocephin for strep + MSSA pleural fluid cx.  Pharmacy has been consulted for Vancomycin dosing for empiric MRSA coverage.  Pt was previously on Vancomycin with sub-therapeutic Vancomycin troughs.  Renal fxn stable.  Pt received 1500mg  IV Vancomycin pre-op today ~ 1500.  Plan: Vancomycin 2gm IV x 1 loading dose (at 2200) Then start 1250mg  IV q8h maintenance dose. Vancomycin trough at steady state. Follow-up on repeat cx data.  Height: 5\' 9"  (175.3 cm) Weight: 219 lb 11.2 oz (99.7 kg) IBW/kg (Calculated) : 70.7  Temp (24hrs), Avg:97.7 F (36.5 C), Min:97.2 F (36.2 C), Max:98.2 F (36.8 C)  Recent Labs  Lab 09/26/17 0633 09/26/17 0831 09/27/17 0723 09/27/17 1213 09/29/17 0611 09/30/17 0441 09/30/17 0755  WBC 14.5*  --  13.5*  --  17.5* 19.0* 17.8*  CREATININE 0.86 0.81 0.76  --  0.63 0.76 0.75  VANCOTROUGH  --   --   --  7* 8*  --   --     Estimated Creatinine Clearance: 137.2 mL/min (by C-G formula based on SCr of 0.75 mg/dL).    Allergies  Allergen Reactions  . Erythromycin Swelling    SWELLING REACTION UNSPECIFIED  "I Cannot take any mycins"  . Penicillins Swelling    SWELLING REACTION UNSPECIFIED  "I Cannot take any cillins" PATIENT HAS HAD A PCN REACTION WITH IMMEDIATE RASH, FACIAL/TONGUE/THROAT SWELLING, SOB, OR LIGHTHEADEDNESS WITH HYPOTENSION:  #  #  YES  #  #  Has patient had a PCN reaction causing severe rash involving mucus membranes or skin necrosis: unk Has patient had a PCN reaction that required hospitalization: unk Has patient had a PCN reaction occurring within the last 10 years: no   . Sulfa Antibiotics     UNSPECIFIED REACTION  "Cannot take any sulfa drugs"    Thank you for allowing pharmacy to be a part of this patient's care.  [employer], Pharm.D., BCPS Clinical  Pharmacist 09/30/2017 9:43 PM

## 2017-09-30 NOTE — Anesthesia Procedure Notes (Signed)
Procedure Name: Intubation Date/Time: 09/30/2017 3:13 PM Performed by: Julieta Bellini, CRNA Pre-anesthesia Checklist: Patient identified, Emergency Drugs available, Suction available and Patient being monitored Patient Re-evaluated:Patient Re-evaluated prior to induction Oxygen Delivery Method: Circle system utilized Preoxygenation: Pre-oxygenation with 100% oxygen Induction Type: Combination inhalational/ intravenous induction Ventilation: Mask ventilation with difficulty, Two handed mask ventilation required and Oral airway inserted - appropriate to patient size Laryngoscope Size: Mac and 2 Grade View: Grade I Endobronchial tube: Left and Double lumen EBT and 39 Fr Number of attempts: 1 Airway Equipment and Method: Stylet and Fiberoptic brochoscope Placement Confirmation: ETT inserted through vocal cords under direct vision,  positive ETCO2 and breath sounds checked- equal and bilateral Secured at: 29 cm Tube secured with: Tape Dental Injury: Teeth and Oropharynx as per pre-operative assessment

## 2017-09-30 NOTE — Anesthesia Postprocedure Evaluation (Signed)
Anesthesia Post Note  Patient: Jeremy Dillon  Procedure(s) Performed: VIDEO BRONCHOSCOPY (N/A ) DRAINAGE OF PLEURAL EFFUSION (Left ) VIDEO ASSISTED THORACOSCOPY (VATS)/DECORTICATION (Left Chest)     Patient location during evaluation: PACU Anesthesia Type: General Level of consciousness: sedated and patient cooperative Pain management: pain level controlled Vital Signs Assessment: post-procedure vital signs reviewed and stable Respiratory status: spontaneous breathing Cardiovascular status: stable Anesthetic complications: no    Last Vitals:  Vitals:   09/30/17 2015 09/30/17 2030  BP: 128/75   Pulse: (!) 122 (!) 125  Resp: (!) 27 (!) 24  Temp:  (!) 36.2 C  SpO2: 93% 100%    Last Pain:  Vitals:   09/30/17 2015  TempSrc:   PainSc: [place]

## 2017-09-30 NOTE — Anesthesia Preprocedure Evaluation (Addendum)
Anesthesia Evaluation  Patient identified by MRN, date of birth, ID band Patient awake    Reviewed: Allergy & Precautions, NPO status , Patient's Chart, lab work & pertinent test results  Airway Mallampati: III  TM Distance: >3 FB Neck ROM: Full    Dental no notable dental hx.    Pulmonary former smoker,     + decreased breath sounds (left)      Cardiovascular hypertension, Pt. on medications Normal cardiovascular exam Rhythm:Regular Rate:Normal     Neuro/Psych PSYCHIATRIC DISORDERS Anxiety Depression Bipolar Disorder Schizophrenia negative neurological ROS     GI/Hepatic negative GI ROS, Neg liver ROS,   Endo/Other  negative endocrine ROS  Renal/GU negative Renal ROS     Musculoskeletal negative musculoskeletal ROS (+)   Abdominal (+) + obese,   Peds  Hematology  (+) anemia ,   Anesthesia Other Findings  LOCULATED LEFT EFFUSION  Reproductive/Obstetrics                           Anesthesia Physical Anesthesia Plan  ASA: III  Anesthesia Plan: General   Post-op Pain Management:    Induction: Intravenous  PONV Risk Score and Plan: 2 and Dexamethasone, Ondansetron and Treatment may vary due to age or medical condition  Airway Management Planned: Oral ETT and Double Lumen EBT  Additional Equipment:   Intra-op Plan:   Post-operative Plan: Extubation in OR  Informed Consent: I have reviewed the patients History and Physical, chart, labs and discussed the procedure including the risks, benefits and alternatives for the proposed anesthesia with the patient or authorized representative who has indicated his/her understanding and acceptance.   Dental advisory given  Plan Discussed with: CRNA  Anesthesia Plan Comments:         Anesthesia Quick Evaluation

## 2017-09-30 NOTE — Interval H&P Note (Signed)
History and Physical Interval Note:  09/30/2017 1:40 PM  Jeremy Dillon  has presented today for surgery, with the diagnosis of LOCULATED LEFT EFFUSION  The various methods of treatment have been discussed with the patient and family. After consideration of risks, benefits and other options for treatment, the patient has consented to  Procedure(s): VIDEO BRONCHOSCOPY (N/A) VIDEO ASSISTED THORACOSCOPY (Left) DRAINAGE OF PLEURAL EFFUSION (Left) as a surgical intervention .  The patient's history has been reviewed, patient examined, no change in status, stable for surgery.  I have reviewed the patient's chart and labs.  Questions were answered to the patient's satisfaction.     Melrose Nakayama

## 2017-09-30 NOTE — Transfer of Care (Signed)
Immediate Anesthesia Transfer of Care Note  Patient: Jeremy Dillon  Procedure(s) Performed: VIDEO BRONCHOSCOPY (N/A ) DRAINAGE OF PLEURAL EFFUSION (Left ) VIDEO ASSISTED THORACOSCOPY (VATS)/DECORTICATION (Left Chest)  Patient Location: PACU  Anesthesia Type:General  Level of Consciousness: awake, alert  and oriented  Airway & Oxygen Therapy: Patient Spontanous Breathing and Patient connected to face mask oxygen  Post-op Assessment: Report given to RN and Post -op Vital signs reviewed and stable  Post vital signs: Reviewed and stable  Last Vitals:  Vitals Value Taken Time  BP 143/75 09/30/2017  7:50 PM  Temp 36.2 C 09/30/2017  7:47 PM  Pulse 129 09/30/2017  7:52 PM  Resp 32 09/30/2017  7:52 PM  SpO2 90 % 09/30/2017  7:52 PM  Vitals shown include unvalidated device data.  Last Pain:  Vitals:   09/30/17 1232  TempSrc: Oral  PainSc:       Patients Stated Pain Goal: 2 (10/62/69 4854)  Complications: No apparent anesthesia complications

## 2017-09-30 NOTE — Brief Op Note (Addendum)
09/26/2017 - 09/30/2017  7:15 PM  PATIENT:  Jeremy Dillon  47 y.o. male  PRE-OPERATIVE DIAGNOSIS:  1. Loculated left pleural effusion 2. Left empyema  POST-OPERATIVE DIAGNOSIS:  1. Loculated left pleural effusion 2. Left empyema  PROCEDURE: VIDEO BRONCHOSCOPY,  LEFT VIDEO ASSISTED THORACOSCOPY (VATS), DRAINAGE OF LEFT  EMPYEMA VISCERAL AND PARIETAL PLEURAL DECORTICATION   SURGEON:  Surgeon(s) and Role:    Melrose Nakayama, MD - Primary  PHYSICIAN ASSISTANT: Lars Pinks PA-C  ANESTHESIA:   general  EBL:  400 mL   BLOOD ADMINISTERED:One CC PRBC  DRAINS: 3 54 Blake drains placed in the left pleural spaces   LOCAL MEDICATIONS USED:  Exparel  SPECIMEN:  Source of Specimen:  Left pleural fluid and peel  DISPOSITION OF SPECIMEN:  and culture  COUNTS CORRECT:  YES  DICTATION: .Dragon Dictation  PLAN OF CARE: Admit to inpatient   PATIENT DISPOSITION:  PACU - hemodynamically stable.   Delay start of Pharmacological VTE agent (>24hrs) due to surgical blood loss or risk of bleeding: yes

## 2017-09-30 NOTE — Progress Notes (Signed)
[facility]                                                                              Patient Demographics  Jeremy Dillon, is a 47 y.o. male, DOB - [birth year], [id number]:[id number]  Admit date - 09/26/2017   Admitting Physician Rise Patience, MD  Outpatient Primary MD for the patient is Patient, No Pcp Per  Outpatient specialists:   LOS - 4  days   Medical records reviewed and are as summarized below:    No chief complaint on file.      Brief summary   Patient is a 47 year old male with hypertension, schizophrenia, iron deficiency anemia presented to [facility] ED with left-sided chest pain and shortness of breath for last 2 days.  No fevers or chills.  In ED, patient was found to be tachycardia, dyspnea, CT scan of the chest showed very large left pleural effusion with possible loculation and collapse of much of the left lung, concerning for any underlying malignancy.  Patient was started on IV vancomycin and Levaquin and transferred to [facility] for further work-up.   Assessment & Plan    Left empyema -history of pneumonia in February -CT chest concerning for large left pleural effusion possible loculation and collapse -Status post thoracentesis, 1.4 L drained, fluid was exudative -pleural fluid culture with MSSA and beta strep, Abx changed to Ceftriaxone per Dr.Hendrickson -CVTS consulting, plan for VATS/decortication today     Hypertension with tachycardia -held amlodipine, continue Lopressor 25 mg twice a day     Microcytic hypochromic anemia -Recent EGD and colonoscopy and per patient were unremarkable, continue iron supplements -could have B thal, FU with PCP  History of schizophrenia -Continue Risperdal, Zoloft  Code Status: Full code DVT Prophylaxis:  SCD's Family Communication: Discussed in detail with the patient, all imaging results, lab results explained to the patient    Disposition Plan: home pending VATS/post  op course per CVTS  Time Spent in minutes   25 minutes  Procedures:  CT chest Thoracentesis 6/8  Consultants:   Pulmonary critical care TCTS  Antimicrobials:   IV Levaquin 6/8  IV vancomycin   Medications  Scheduled Meds: . ferrous sulfate  325 mg Oral BID  . gabapentin  300 mg Oral TID  . metoprolol tartrate  25 mg Oral BID  . OLANZapine  15 mg Oral QHS  . QUEtiapine  50 mg Oral QHS  . venlafaxine XR  75 mg Oral Daily   Continuous Infusions: . cefTRIAXone (ROCEPHIN)  IV Stopped (09/30/17 1128)  . vancomycin     PRN Meds:.acetaminophen **OR** acetaminophen, ondansetron **OR** ondansetron (ZOFRAN) IV   Antibiotics   Anti-infectives (From admission, onward)   Start     Dose/Rate Route Frequency Ordered Stop   09/30/17 1300  vancomycin (VANCOCIN) 1,500 mg in sodium chloride 0.9 % 500 mL IVPB     1,500 mg 250 mL/hr over 120 Minutes Intravenous To ShortStay Surgical 09/30/17 0657 10/01/17 1300   09/29/17 1245  cefTRIAXone (ROCEPHIN) 2 g in sodium chloride 0.9 % 100 mL IVPB     2 g 200 mL/hr over 30 Minutes Intravenous  Daily 09/29/17 1234     09/27/17 2100  vancomycin (VANCOCIN) 1,250 mg in sodium chloride 0.9 % 250 mL IVPB  Status:  Discontinued     1,250 mg 166.7 mL/hr over 90 Minutes Intravenous Every 8 hours 09/27/17 1426 09/29/17 1234   09/27/17 0600  levofloxacin (LEVAQUIN) IVPB 750 mg  Status:  Discontinued     750 mg 100 mL/hr over 90 Minutes Intravenous Every 24 hours 09/26/17 0823 09/26/17 1228   09/27/17 0000  levofloxacin (LEVAQUIN) IVPB 750 mg  Status:  Discontinued     750 mg 100 mL/hr over 90 Minutes Intravenous Every 24 hours 09/26/17 1246 09/29/17 1234   09/26/17 1300  vancomycin (VANCOCIN) IVPB 1000 mg/200 mL premix  Status:  Discontinued     1,000 mg 200 mL/hr over 60 Minutes Intravenous Every 8 hours 09/26/17 1246 09/27/17 1426   09/26/17 0815  vancomycin (VANCOCIN) 1,500 mg in sodium chloride 0.9 % 500 mL IVPB  Status:  Discontinued     1,500  mg 250 mL/hr over 120 Minutes Intravenous  Once 09/26/17 0808 09/26/17 0824   09/26/17 0730  levofloxacin (LEVAQUIN) IVPB 750 mg  Status:  Discontinued     750 mg 100 mL/hr over 90 Minutes Intravenous Every 24 hours 09/26/17 0720 09/26/17 0823        Subjective:   -, reports some dyspnea and shortness of breath with exertion  Objective:   Vitals:   09/29/17 1533 09/29/17 2033 09/30/17 0509 09/30/17 1232  BP: 134/82 (!) 142/86 133/83 108/74  Pulse: (!) 129 (!) 127 (!) 115 (!) 107  Resp: 18   18  Temp: 99.7 F (37.6 C) 98.1 F (36.7 C) 98 F (36.7 C) 98.2 F (36.8 C)  TempSrc: Oral Oral  Oral  SpO2: 95% 98% 94% 95%  Weight:   99.7 kg (219 lb 11.2 oz)   Height:        Intake/Output Summary (Last 24 hours) at 09/30/2017 1327 Last data filed at 09/29/2017 1900 Gross per 24 hour  Intake 443.33 ml  Output 900 ml  Net -456.67 ml     Wt Readings from Last 3 Encounters:  09/30/17 99.7 kg (219 lb 11.2 oz)     Exam Gen: Awake, Alert, Oriented X 3, no distress HEENT: PERRLA, Neck supple, no JVD Lungs: decreased breath sounds at the left CVS: RRR,No Gallops,Rubs or new Murmurs Abd: soft, Non tender, non distended, BS present Extremities: No Cyanosis, Clubbing or edema Skin: no new rashes   Data Reviewed:  I have personally reviewed following labs and imaging studies  Micro Results Recent Results (from the past 240 hour(s))  Culture, blood (routine x 2)     Status: None (Preliminary result)   Collection Time: 09/26/17  6:40 AM  Result Value Ref Range Status   Specimen Description BLOOD RIGHT ANTECUBITAL  Final   Special Requests   Final    BOTTLES DRAWN AEROBIC ONLY Blood Culture adequate volume   Culture   Final    NO GROWTH 4 DAYS Performed at [facility], [address], [place], [place] [address]    Report Status PENDING  Incomplete  Culture, blood (routine x 2)     Status: None (Preliminary result)   Collection Time: 09/26/17  6:47 AM  Result Value  Ref Range Status   Specimen Description BLOOD LEFT HAND  Final   Special Requests   Final    BOTTLES DRAWN AEROBIC AND ANAEROBIC Blood Culture adequate volume   Culture  Final    NO GROWTH 4 DAYS Performed at [facility], [place] [address], [place], [place] [address]    Report Status PENDING  Incomplete  Culture, body fluid-bottle     Status: Abnormal   Collection Time: 09/26/17  3:32 PM  Result Value Ref Range Status   Specimen Description PLEURAL LEFT  Final   Special Requests NONE  Final   Gram Stain   Final    GRAM POSITIVE COCCOBACILLUS IN BOTH AEROBIC AND ANAEROBIC BOTTLES CRITICAL RESULT CALLED TO, READ BACK BY AND VERIFIED WITH: R DUNN,RN AT 6440 09/27/17 BY L BENFIELD Performed at [facility], [place] [address] [place], [place] [address]    Culture (A)  Final    NON-GROUPABLE BETA STREPTOCOCCUS STAPHYLOCOCCUS AUREUS    Report Status 09/29/2017 FINAL  Final   Organism ID, Bacteria NON-GROUPABLE BETA STREPTOCOCCUS  Final   Organism ID, Bacteria STAPHYLOCOCCUS AUREUS  Final      Susceptibility   Staphylococcus aureus - MIC*    CIPROFLOXACIN <=0.5 SENSITIVE Sensitive     ERYTHROMYCIN >=8 RESISTANT Resistant     GENTAMICIN <=0.5 SENSITIVE Sensitive     OXACILLIN <=0.25 SENSITIVE Sensitive     TETRACYCLINE <=1 SENSITIVE Sensitive     VANCOMYCIN <=0.5 SENSITIVE Sensitive     TRIMETH/SULFA <=10 SENSITIVE Sensitive     CLINDAMYCIN <=0.25 SENSITIVE Sensitive     RIFAMPIN <=0.5 SENSITIVE Sensitive     Inducible Clindamycin NEGATIVE Sensitive     * STAPHYLOCOCCUS AUREUS   Non-groupable beta streptococcus - MIC*    PENICILLIN INTERMEDIATE Intermediate     CEFTRIAXONE 1 SENSITIVE Sensitive     ERYTHROMYCIN <=0.12 SENSITIVE Sensitive     LEVOFLOXACIN <=0.25 SENSITIVE Sensitive     VANCOMYCIN 0.5 SENSITIVE Sensitive     * NON-GROUPABLE BETA STREPTOCOCCUS  Gram stain     Status: None   Collection Time: 09/26/17  3:32 PM  Result Value Ref Range Status   Specimen  Description PLEURAL LEFT  Final   Special Requests NONE  Final   Gram Stain   Final    WBC PRESENT, PREDOMINANTLY PMN NO ORGANISMS SEEN CYTOSPIN SMEAR Performed at [facility], [address], [place], [place] [address]    Report Status 09/26/2017 FINAL  Final    Radiology Reports Dg Chest Port 1 View  Result Date: 09/26/2017 CLINICAL DATA:  Large left pleural effusion. Status post thoracentesis. EXAM: PORTABLE CHEST 1 VIEW COMPARISON:  Prior today FINDINGS: Large left pleural effusion shows slight decrease in size since previous study. There is mildly improved aeration left upper lung field. Right lung remains clear. No pneumothorax visualized. IMPRESSION: Mild decrease in size of large left pleural effusion. No pneumothorax visualized. Electronically Signed   By: Earle Gell M.D.   On: 09/26/2017 16:27   Dg Chest Port 1 View  Result Date: 09/26/2017 CLINICAL DATA:  Acute onset of shortness of breath. EXAM: PORTABLE CHEST 1 VIEW COMPARISON:  Chest radiograph and CT of the chest performed earlier today at 12:07 a.m. and 12:33 a.m. FINDINGS: A very large left-sided pleural effusion is again noted, with underlying airspace opacification. Underlying vascular congestion is noted. No pneumothorax is seen. The cardiomediastinal silhouette is not well characterized due to the adjacent pleural effusion. No acute osseous abnormalities are identified. IMPRESSION: Very large left-sided pleural effusion again noted, with underlying airspace opacification. Vascular congestion seen. Electronically Signed   By: Garald Balding M.D.   On: 09/26/2017 06:11    Lab Data:  CBC: Recent Labs  Lab 09/26/17 0633 09/27/17 0723 09/29/17 0611 09/30/17 0441 09/30/17 0755  WBC 14.5* 13.5* 17.5* 19.0* 17.8*  NEUTROABS 11.7*  --   --   --   --   HGB 9.4* 9.5* 9.1* 9.4* 8.9*  HCT 32.9* 33.7* 32.2* 33.9* 32.2*  MCV 78.5 78.9 78.7 79.2 79.7  PLT 358 396 422* 444* 170*   Basic Metabolic Panel: Recent Labs    Lab 09/26/17 0831 09/27/17 0723 09/29/17 0611 09/30/17 0441 09/30/17 0755  NA 135 134* 138 138 141  K 4.0 3.7 3.7 4.4 4.2  CL 98* 99* 99* 104 102  CO2 26 25 29 25 30   GLUCOSE 120* 120* 117* 127* 132*  BUN 9 12 8 10 9   CREATININE 0.81 0.76 0.63 0.76 0.75  CALCIUM 8.1* 8.0* 8.3* 8.2* 8.3*   GFR: Estimated Creatinine Clearance: 137.2 mL/min (by C-G formula based on SCr of 0.75 mg/dL). Liver Function Tests: Recent Labs  Lab 09/26/17 0633 09/26/17 0831 09/30/17 0755  AST 18  --  33  ALT 23  --  45  ALKPHOS 55  --  59  BILITOT 0.6  --  0.4  PROT 6.6 6.2* 5.9*  ALBUMIN 2.2*  --  1.9*   No results for input(s): LIPASE, AMYLASE in the last 168 hours. No results for input(s): AMMONIA in the last 168 hours. Coagulation Profile: Recent Labs  Lab 09/30/17 0755  INR 1.40   Cardiac Enzymes: No results for input(s): CKTOTAL, CKMB, CKMBINDEX, TROPONINI in the last 168 hours. BNP (last 3 results) No results for input(s): PROBNP in the last 8760 hours. HbA1C: No results for input(s): HGBA1C in the last 72 hours. CBG: Recent Labs  Lab 09/28/17 1117 09/28/17 2353 09/30/17 0223 09/30/17 0747 09/30/17 1230  GLUCAP 134* 139* 119* 118* 103*   Lipid Profile: No results for input(s): CHOL, HDL, LDLCALC, TRIG, CHOLHDL, LDLDIRECT in the last 72 hours. Thyroid Function Tests: No results for input(s): TSH, T4TOTAL, FREET4, T3FREE, THYROIDAB in the last 72 hours. Anemia Panel: No results for input(s): VITAMINB12, FOLATE, FERRITIN, TIBC, IRON, RETICCTPCT in the last 72 hours. Urine analysis:    Component Value Date/Time   COLORURINE YELLOW 12/04/2011 0315   APPEARANCEUR CLEAR 12/04/2011 0315   LABSPEC 1.011 12/04/2011 0315   PHURINE 6.0 12/04/2011 0315   GLUCOSEU NEGATIVE 12/04/2011 0315   HGBUR NEGATIVE 12/04/2011 0315   BILIRUBINUR NEGATIVE 12/04/2011 0315   KETONESUR NEGATIVE 12/04/2011 0315   PROTEINUR NEGATIVE 12/04/2011 0315   UROBILINOGEN 0.2 12/04/2011 0315   NITRITE  NEGATIVE 12/04/2011 0315   LEUKOCYTESUR NEGATIVE 12/04/2011 0315     Domenic Polite M.D. [facility] 09/30/2017, 1:27 PM  Page via [email or web address] - password [email or web address]  Call night coverage person covering after 7pm

## 2017-10-01 ENCOUNTER — Inpatient Hospital Stay (HOSPITAL_COMMUNITY): Payer: PPO

## 2017-10-01 ENCOUNTER — Encounter (HOSPITAL_COMMUNITY): Payer: Self-pay | Admitting: Thoracic Surgery (Cardiothoracic Vascular Surgery)

## 2017-10-01 DIAGNOSIS — J90 Pleural effusion, not elsewhere classified: Secondary | ICD-10-CM | POA: Diagnosis not present

## 2017-10-01 LAB — GLUCOSE, CAPILLARY
GLUCOSE-CAPILLARY: 174 mg/dL — AB (ref 65–99)
GLUCOSE-CAPILLARY: 163 mg/dL — AB (ref 65–99)
GLUCOSE-CAPILLARY: 141 mg/dL — AB (ref 65–99)
Glucose-Capillary: 295 mg/dL — ABNORMAL HIGH (ref 65–99)

## 2017-10-01 LAB — BASIC METABOLIC PANEL
ANION GAP: 4 — AB (ref 5–15)
BUN: 10 mg/dL (ref 6–20)
CALCIUM: 7.4 mg/dL — AB (ref 8.9–10.3)
CO2: 30 mmol/L (ref 22–32)
Chloride: 103 mmol/L (ref 101–111)
Creatinine, Ser: 0.67 mg/dL (ref 0.61–1.24)
GFR calc Af Amer: >60 mL/min (ref >60)
GLUCOSE: 213 mg/dL — AB (ref 65–99)
POTASSIUM: 4.9 mmol/L (ref 3.5–5.1)
SODIUM: 137 mmol/L (ref 135–145)

## 2017-10-01 LAB — BLOOD GAS, ARTERIAL
Acid-Base Excess: 4.1 mmol/L — ABNORMAL HIGH (ref 0.0–2.0)
Bicarbonate: 29 mmol/L — ABNORMAL HIGH (ref 20.0–28.0)
DRAWN BY: 25203
O2 CONTENT: 6 L/min
O2 SAT: 97.6 %
PATIENT TEMPERATURE: 98.6
pCO2 arterial: 50.9 mmHg — ABNORMAL HIGH (ref 32.0–48.0)
pH, Arterial: 7.374 (ref 7.350–7.450)
pO2, Arterial: 107 mmHg (ref 83.0–108.0)

## 2017-10-01 LAB — CULTURE, BLOOD (ROUTINE X 2)
Culture: NO GROWTH
Culture: NO GROWTH
SPECIAL REQUESTS: ADEQUATE
Special Requests: ADEQUATE

## 2017-10-01 LAB — VANCOMYCIN, TROUGH: VANCOMYCIN TR: 18 ug/mL (ref 15–20)

## 2017-10-01 LAB — CBC
HEMATOCRIT: 24.7 % — AB (ref 39.0–52.0)
HEMOGLOBIN: 7.3 g/dL — AB (ref 13.0–17.0)
MCH: 24.2 pg — ABNORMAL LOW (ref 26.0–34.0)
MCHC: 29.6 g/dL — ABNORMAL LOW (ref 30.0–36.0)
MCV: 81.8 fL (ref 78.0–100.0)
Platelets: 394 10*3/uL (ref 150–400)
RBC: 3.02 MIL/uL — AB (ref 4.22–5.81)
RDW: 21 % — ABNORMAL HIGH (ref 11.5–15.5)
WBC: 19.9 10*3/uL — AB (ref 4.0–10.5)

## 2017-10-01 LAB — CHOLESTEROL, BODY FLUID: CHOL FL: 67 mg/dL

## 2017-10-01 MED ORDER — SIMETHICONE 80 MG PO CHEW
80.0000 mg | CHEWABLE_TABLET | Freq: Four times a day (QID) | ORAL | Status: DC
  Administered 2017-10-01 – 2017-10-06 (×12): 80 mg via ORAL
  Filled 2017-10-01 (×13): qty 1

## 2017-10-01 MED ORDER — METOCLOPRAMIDE HCL 5 MG/ML IJ SOLN
10.0000 mg | Freq: Four times a day (QID) | INTRAMUSCULAR | Status: AC
  Administered 2017-10-01 (×4): 10 mg via INTRAVENOUS
  Filled 2017-10-01 (×4): qty 2

## 2017-10-01 MED ORDER — ENOXAPARIN SODIUM 40 MG/0.4ML ~~LOC~~ SOLN
40.0000 mg | SUBCUTANEOUS | Status: DC
  Administered 2017-10-01 – 2017-10-06 (×6): 40 mg via SUBCUTANEOUS
  Filled 2017-10-01 (×6): qty 0.4

## 2017-10-01 MED ORDER — INSULIN ASPART 100 UNIT/ML ~~LOC~~ SOLN
0.0000 [IU] | Freq: Three times a day (TID) | SUBCUTANEOUS | Status: DC
  Administered 2017-10-01 – 2017-10-02 (×3): 3 [IU] via SUBCUTANEOUS

## 2017-10-01 MED ORDER — KETOROLAC TROMETHAMINE 30 MG/ML IJ SOLN
30.0000 mg | Freq: Four times a day (QID) | INTRAMUSCULAR | Status: AC
  Administered 2017-10-01 (×3): 30 mg via INTRAVENOUS
  Filled 2017-10-01 (×3): qty 1

## 2017-10-01 NOTE — Progress Notes (Signed)
Pharmacy Antibiotic Note  Jeremy Dillon is a 47 y.o. male admitted on 09/26/2017 with CP and SOB, now being tx'd for empyema.  Pharmacy has been consulted for vancomycin dosing.  Vanc trough at goal though last dose was given a little late.  Plan: This patient's current antibiotics will be continued without adjustments.  Temp (24hrs), Avg:98.3 F (36.8 C), Min:97.8 F (36.6 C), Max:98.8 F (37.1 C)  Recent Labs  Lab 09/27/17 0723  09/29/17 0611 09/30/17 0441 09/30/17 0755 10/01/17 0415 10/01/17 2157  WBC 13.5*  --  17.5* 19.0* 17.8* 19.9*  --   CREATININE 0.76  --  0.63 0.76 0.75 0.67  --   VANCOTROUGH  --    < > 8*  --   --   --  18   < > = values in this interval not displayed.     Thank you for allowing pharmacy to be a part of this patient's care.  Wynona Neat, PharmD, BCPS  10/01/2017 11:46 PM

## 2017-10-01 NOTE — Progress Notes (Signed)
Pt seen, currently in ICU post VATS/decortication and drainage of L Empyema yesterday Continue Abx and chest tube management Rest of his medical problems are stable,   Domenic Polite, MD

## 2017-10-01 NOTE — Progress Notes (Signed)
1 Day Post-Op Procedure(s) (LRB): VIDEO BRONCHOSCOPY (N/A) DRAINAGE OF PLEURAL EFFUSION (Left) VIDEO ASSISTED THORACOSCOPY (VATS)/DECORTICATION (Left) Subjective: C/o incisional pain, irritation of ear by O2 tubing  Objective: Vital signs in last 24 hours: Temp:  [97.2 F (36.2 C)-98.8 F (37.1 C)] 98.8 F (37.1 C) (06/13 0753) Pulse Rate:  [104-139] 109 (06/13 0700) Cardiac Rhythm: Sinus tachycardia (06/12 2100) Resp:  [15-33] 21 (06/13 0700) BP: (108-146)/(71-125) 113/72 (06/13 0700) SpO2:  [91 %-100 %] 100 % (06/13 0700) Arterial Line BP: (121-137)/(62-67) 123/62 (06/12 2030) Weight:  [220 lb 7.4 oz (100 kg)] 220 lb 7.4 oz (100 kg) (06/13 0600)  Hemodynamic parameters for last 24 hours:    Intake/Output from previous day: 06/12 0701 - 06/13 0700 In: 4075 [I.V.:2560; Blood:315; IV Piggyback:1200] Out: 2030 [Urine:1060; Blood:400; Chest Tube:570] Intake/Output this shift: No intake/output data recorded.  General appearance: alert, cooperative and mild distress Neurologic: intact Heart: tachy, regular Lungs: diminished breath sounds bibasilar and wheezes bilaterally Abdomen: mildly distended, nontender small air leak, serosanguinous drainage from CT  Lab Results: Recent Labs    09/30/17 0755  09/30/17 1914 10/01/17 0415  WBC 17.8*  --   --  19.9*  HGB 8.9*   < > 8.2* 7.3*  HCT 32.2*   < > 24.0* 24.7*  PLT 413*  --   --  394   < > = values in this interval not displayed.   BMET:  Recent Labs    09/30/17 0755  09/30/17 1914 10/01/17 0415  NA 141   < > 139 137  K 4.2   < > 4.9 4.9  CL 102  --   --  103  CO2 30  --   --  30  GLUCOSE 132*  --   --  213*  BUN 9  --   --  10  CREATININE 0.75  --   --  0.67  CALCIUM 8.3*  --   --  7.4*   < > = values in this interval not displayed.    PT/INR:  Recent Labs    09/30/17 0755  LABPROT 17.1*  INR 1.40   ABG    Component Value Date/Time   PHART 7.374 10/01/2017 0340   HCO3 29.0 (H) 10/01/2017 0340   TCO2 31 09/30/2017 1914   O2SAT 97.6 10/01/2017 0340   CBG (last 3)  Recent Labs    09/30/17 1230 10/01/17 0014 10/01/17 0551  GLUCAP 103* 295* 174*    Assessment/Plan: S/P Procedure(s) (LRB): VIDEO BRONCHOSCOPY (N/A) DRAINAGE OF PLEURAL EFFUSION (Left) VIDEO ASSISTED THORACOSCOPY (VATS)/DECORTICATION (Left) POD # 1  CV- sinus tachy, BP OK  RESP/ ID- s/p drainage of empyema and decortication  Grew MSSA and strep preop. Antibiotics broadened initially postop until cultures return  IS, add flutter valve  RENAL- creatinine OK  ENDO- CBG elevated on d5- change to KVO  GI- some gastric distention on CXR - advance diet as tolerated, Reglan x 24 hours  OOB  SCD + enoxaparin for DVT prophylaxis   LOS: 5 days    Melrose Nakayama 10/01/2017

## 2017-10-01 NOTE — Discharge Instructions (Signed)
Thoracotomy, Care After ° °This sheet gives you information about how to care for yourself after your procedure. Your doctor may also give you more specific instructions. If you have problems or questions, contact your doctor. °Follow these instructions at home: °Preventing lung infection (  °pneumonia) °· Take deep breaths or do breathing exercises as told by your doctor. °· Cough often. Coughing is important to clear thick spit (phlegm) and open your lungs. If coughing hurts, hold a pillow against your chest or place both hands flat on top of your cut (splinting) when you cough. This may help with discomfort. °· Use an incentive spirometer as told. This is a tool that measures how well you fill your lungs with each breath. °· Do lung therapy (pulmonary rehabilitation) as told. °Medicines °· Take over-the-counter or prescription medicines only as told by your doctor. °· If you have pain, take pain-relieving medicine before your pain gets very bad. This will help you breathe and cough more comfortably. °· If you were prescribed an antibiotic medicine, take it as told by your doctor. Do not stop taking the antibiotic even if you start to feel better. °Activity °· Ask your doctor what activities are safe for you. °· Do not travel by airplane for 2 weeks after your chest tube is removed, or until your doctor says that this is safe. °· Do not lift anything that is heavier than 10 lb (4.5 kg), or the limit that your doctor tells you, until he or she says that it is safe. °· Do not drive until your doctor approves. °¨ Do not drive or use heavy machinery while taking prescription pain medicine. °Incision care °· Follow instructions from your doctor about how to take care of your cut from surgery (incision). Make sure you: °¨ Wash your hands with soap and water before you change your bandage (dressing). If you cannot use soap and water, use hand sanitizer. °¨ Change your bandage as told by your doctor. °¨ Leave stitches  (sutures), skin glue, or skin tape (adhesive) strips in place. They may need to stay in place for 2 weeks or longer. If tape strips get loose and curl up, you may trim the loose edges. Do not remove tape strips completely unless your doctor says it is okay. °· Keep your bandage dry. °· Check your cut from surgery every day for signs of infection. Check for: °¨ More redness, swelling, or pain. °¨ More fluid or blood. °¨ Warmth. °¨ Pus or a bad smell. °Bathing °· Do not take baths, swim, or use a hot tub until your doctor approves. You may take showers. °· After your bandage has been removed, use soap and water to gently wash your cut from surgery. Do not use anything else to clean your cut unless your doctor tells you to. °Eating and drinking °· Eat a healthy diet as told by your doctor. A healthy diet includes: °¨ Fresh fruits and vegetables. °¨ Whole grains. °¨ Low-fat (lean) proteins. °· Drink enough fluid to keep your pee (urine) clear or pale yellow. °General instructions °· To prevent or treat trouble pooping (constipation) while you are taking prescription pain medicine, your doctor may recommend that you: °¨ Take over-the-counter or prescription medicines. °¨ Eat foods that are high in fiber. These include fresh fruits and vegetables, whole grains, and beans. °¨ Limit foods that are high in fat and processed sugars, such as fried and sweet foods. °· Do not use any products that contain nicotine or tobacco. These   include cigarettes and e-cigarettes. If you need help quitting, ask your doctor. °· Avoid secondhand smoke. °· Wear compression stockings as told. These help to prevent blood clots and reduce swelling in your legs. °· If you have a chest tube, care for it as told. °· Keep all follow-up visits as told by your doctor. This is important. °Contact a doctor if: °· You have more redness, swelling, or pain around your cut from surgery. °· You have more fluid or blood coming from your cut from  surgery. °· Your cut from surgery feels warm to the touch. °· You have pus or a bad smell coming from your cut from surgery. °· You have a fever or chills. °· Your heartbeat seems uneven. °· You feel sick to your stomach (nauseous). °· You throw up (vomit). °· You have muscle aches. °· You have trouble pooping (having a bowel movement). This may mean that you: °¨ Poop fewer times in a week than normal. °¨ Have a hard time pooping. °¨ Have poop that is dry, hard, or bigger than normal. °Get help right away if: °· You get a rash. °· You feel light-headed. °· You feel like you might pass out (faint). °· You are short of breath. °· You have trouble breathing. °· You are confused. °· You have trouble talking. °· You have problems with your seeing (vision). °· You are not able to move. °· You lose feeling (have numbness) in your: °¨ Face. °¨ Arms. °¨ Legs. °· You pass out. °· You have a sudden, bad headache. °· You feel weak. °· You have chest pain. °· You have pain that: °¨ Is very bad. °¨ Gets worse, even with medicine. °Summary °· Take deep breaths, do breathing exercises, and cough often. This helps prevent lung infection (pneumonia). °· Do not drive until your doctor approves. Do not travel by airplane for 2 weeks after your chest tube is removed, or until your doctor says that this is safe. °· Check your cut from surgery every day for signs of infection. °· Eat a healthy diet. This includes fresh fruits and vegetables, whole grains, and low-fat (lean) proteins. °This information is not intended to replace advice given to you by your health care provider. Make sure you discuss any questions you have with your health care provider. °Document Released: 10/07/2011 Document Revised: 12/31/2015 Document Reviewed: 12/31/2015 °Elsevier Interactive Patient Education © 2017 [employer] ° °

## 2017-10-01 NOTE — Care Management Note (Signed)
Case Management Note Marvetta Gibbons RN,BSN Unit [facility] 1-22 Case Manager  [phone]  Patient Details  Name: Jeremy Dillon MRN: [id number] Date of Birth: 11/06/1970  Subjective/Objective:  PT admitted with acute resp. Failure with large left pl. Effusion. tx from [facility]- dx of loculated effusion now s/p VIDEO BRONCHOSCOPY  DRAINAGE OF PLEURAL EFFUSION (Left) VIDEO ASSISTED THORACOSCOPY (VATS)/DECORTICATION (Left) on 09/30/17             Action/Plan: PTA pt lived at home, was independent, CM to follow for transition of care needs  Expected Discharge Date:                 Expected Discharge Plan:     In-House Referral:     Discharge planning Services  CM Consult  Post Acute Care Choice:    Choice offered to:     DME Arranged:    DME Agency:     HH Arranged:    [place] Agency:     Status of Service:  In process, will continue to follow  If discussed at Long Length of Stay Meetings, dates discussed:    Discharge Disposition:   Additional Comments:  Dawayne Patricia, RN 10/01/2017, 10:58 AM

## 2017-10-01 NOTE — Progress Notes (Signed)
CT Surgery sats OK Walked in hall   abd distended but nontender- no nausea reglan , mylicon

## 2017-10-02 ENCOUNTER — Inpatient Hospital Stay (HOSPITAL_COMMUNITY): Payer: PPO

## 2017-10-02 LAB — COMPREHENSIVE METABOLIC PANEL
ALT: 31 U/L (ref 17–63)
AST: 26 U/L (ref 15–41)
Albumin: 1.7 g/dL — ABNORMAL LOW (ref 3.5–5.0)
Alkaline Phosphatase: 52 U/L (ref 38–126)
Anion gap: 5 (ref 5–15)
BUN: 11 mg/dL (ref 6–20)
CHLORIDE: 105 mmol/L (ref 101–111)
CO2: 29 mmol/L (ref 22–32)
Calcium: 7.3 mg/dL — ABNORMAL LOW (ref 8.9–10.3)
Creatinine, Ser: 0.69 mg/dL (ref 0.61–1.24)
Glucose, Bld: 100 mg/dL — ABNORMAL HIGH (ref 65–99)
Potassium: 4.2 mmol/L (ref 3.5–5.1)
SODIUM: 139 mmol/L (ref 135–145)
TOTAL PROTEIN: 4.6 g/dL — AB (ref 6.5–8.1)
Total Bilirubin: 0.4 mg/dL (ref 0.3–1.2)

## 2017-10-02 LAB — CULTURE, RESPIRATORY

## 2017-10-02 LAB — HEMOGLOBIN AND HEMATOCRIT, BLOOD
HCT: 26.7 % — ABNORMAL LOW (ref 39.0–52.0)
Hemoglobin: 7.9 g/dL — ABNORMAL LOW (ref 13.0–17.0)

## 2017-10-02 LAB — CULTURE, RESPIRATORY W GRAM STAIN: Culture: NO GROWTH

## 2017-10-02 LAB — GLUCOSE, CAPILLARY
GLUCOSE-CAPILLARY: 156 mg/dL — AB (ref 65–99)
Glucose-Capillary: 84 mg/dL (ref 65–99)
Glucose-Capillary: 192 mg/dL — ABNORMAL HIGH (ref 65–99)
Glucose-Capillary: 79 mg/dL (ref 65–99)
Glucose-Capillary: 137 mg/dL — ABNORMAL HIGH (ref 65–99)

## 2017-10-02 LAB — CBC
HCT: 23.3 % — ABNORMAL LOW (ref 39.0–52.0)
Hemoglobin: 6.6 g/dL — CL (ref 13.0–17.0)
MCH: 23.3 pg — AB (ref 26.0–34.0)
MCHC: 28.3 g/dL — ABNORMAL LOW (ref 30.0–36.0)
MCV: 82.3 fL (ref 78.0–100.0)
PLATELETS: 408 10*3/uL — AB (ref 150–400)
RBC: 2.83 MIL/uL — AB (ref 4.22–5.81)
RDW: 21 % — AB (ref 11.5–15.5)
WBC: 15.9 10*3/uL — AB (ref 4.0–10.5)

## 2017-10-02 LAB — PREPARE RBC (CROSSMATCH)

## 2017-10-02 MED ORDER — SODIUM CHLORIDE 0.9 % IV SOLN: Freq: Once | INTRAVENOUS | Status: AC

## 2017-10-02 NOTE — Discharge Summary (Addendum)
Physician Discharge Summary       [address]       [place] [address]             [phone]    Patient ID: Jeremy Dillon MRN: [id number] DOB/AGE: 07/26/1970 47 y.o.  Admit date: 09/26/2017 Discharge date: 10/06/2017  Admission Diagnoses: 1. Left empyema 2. Loculated left pleural effusion  Discharge Diagnoses:  1. S/p bronchoscopy, left VATS, drain empyema, and decortication 2.  Acute respiratory failure with hypoxia (HCC) 3. History of hypertension 4. History of microcytic hypochromic anemia 5. History of Schizophrenia ([place]) 6. History of Bipolar disorder ([place]) 7. History of Depression 8. History of GERD (gastroesophageal reflux disease) 9. History of Anxiety    Procedure (s):  Bronchoscopy, left video-assisted thoracoscopy, drainage of empyema, visceral and parietal pleural decortication by Dr. Roxan Hockey on 09/30/2017.  Pathology: 1. Pleura, peel, left - FIBROFIBRINOUS PRURITUS. - NEGATIVE FOR MALIGNANCY. 2. Pleura, peel, visceral - FIBROFIBRINOUS PRURITUS. - NEGATIVE FOR MALIGNANCY. 3. Pleura, peel, left #2 - FIBROFIBRINOUS PRURITUS. - NEGATIVE FOR MALIGNANCY.  History of Presenting Illness: Jeremy Dillon is a 47 yo man with a past history significant for hypertension, schizophrenia, bipolar disorder, reflux, pneumonia, anxiety and tobacco abuse. Smoked cigarettes for many years more recently has been vaping. Had a respiratory infection, bronchitis or pneumonia back in February. Presented to [facility] ED with a 2 day history of shortness of breath with minimal activity and pleuritic chest/ back pain. Was tachycardic. CXR showed a large pleural effusion. CT showed a multiloculated left pleural effusion. There was some mild mediastinal adenopathy. He was started on vancomycin and levaquin. A left thoracentesis was done on 09/26/2017 and drained 1.4 L of fluid with some symptomatic relief. The fluid was consistent with empyema. Dr. Roxan Hockey  thought the best option for him is Left VATS to drain the effusion. He will also need a bronchoscopy at the same setting to r/o endobronchial lesion.  Dr. Roxan Hockey discussed the general nature of the procedure, including the need for general anesthesia, the incisions to be used and the use of drainage tubes postoperatively with Jeremy Dillon and his parents. He discussed the expected hospital stay, overall recovery and short and long term outcomes. The patient and his parents understand the risks and agreed to proceed with surgery. He underwent a bronchoscopy, left VATS, drainage of empyema, and visceral and parietal pleural decortication.    Brief Hospital Course:  The patient remained afebrile and hemodynamically stable. A line and foley were removed early in the post operative course. The patient had 3 chest tubes. Their  output gradually decreased. Daily chest x rays were obtained and remained stable. All chest tubes were removed by 10/05/2017. Patient is ambulating on room air. Cultures showed MSSA and Strep per op. Patient was on Vancomycin, Ceftriaxone, and Flagyl post op as we awaited OR culture results.  Cultures ultimately showed Staph aureus and none groupable Strep species.  He will be transitioned to Levaquin at discharge for 4 weeks as he is allergic to PCN.  He was anemic upon admission and post op. He did receive a transfusion on 06/14. Patient is tolerating a diet and has had a bowel movement. Wounds are clean and dry. Final chest X ray showed no pneumothorax and stable appearance of post surgical scarring and effusion on left.  He desaturates while he sleeps.  He likely has OSA and require sleep study in the near future.  We have arranged home oxygen in the mean time.  He does have an infiltration of his left forearm at old IV site.  He was instructed to use heat packs as needed.  He should be covered for infection with use of Levaquin.  Patient is felt surgically stable for discharge  today.    Latest Vital Signs: Blood pressure 106/70, pulse (!) 101, temperature 99.1 F (37.3 C), temperature source Oral, resp. rate 15, height 5\' 9"  (1.753 m), weight 230 lb 9.6 oz (104.6 kg), SpO2 100 %.  Physical Exam:  General appearance: alert, cooperative and no distress Heart: regular rate and rhythm Lungs: diminished breath sounds on left Abdomen: soft, non-tender; bowel sounds normal; no masses,  no organomegaly Extremities: extremities normal, atraumatic, no cyanosis or edema Wound: clean and dry    Discharge Condition: Stable and discharged to home.  Recent laboratory studies:  Lab Results  Component Value Date   WBC 14.1 (H) 10/03/2017   HGB 7.7 (L) 10/03/2017   HCT 25.7 (L) 10/03/2017   MCV 80.8 10/03/2017   PLT 477 (H) 10/03/2017   Lab Results  Component Value Date   NA 139 10/03/2017   K 3.7 10/03/2017   CL 104 10/03/2017   CO2 29 10/03/2017   CREATININE 0.59 (L) 10/03/2017   GLUCOSE 105 (H) 10/03/2017      Diagnostic Studies: Dg Chest 1v Repeat Same Day  Result Date: 10/05/2017 CLINICAL DATA:  Chest tube removal. EXAM: CHEST - 1 VIEW SAME DAY COMPARISON:  Chest x-ray from same day at 7:18 a.m. FINDINGS: Interval removal of the left-sided chest tube. No pneumothorax. Volume loss in the left hemithorax with left basilar atelectasis and pleural thickening/small effusion. New elevation of the left hemidiaphragm with gaseous distention of the stomach. The right lung is clear. No pneumothorax. Normal heart size. No acute osseous abnormality. IMPRESSION: 1. Interval removal of the left sided chest tube.  No pneumothorax. 2. Stable postsurgical changes and volume loss in the left hemithorax with new elevation of the left hemidiaphragm and gaseous distention of the stomach. Electronically Signed   By: Titus Dubin M.D.   On: 10/05/2017 11:18   Dg Chest Port 1 View  Result Date: 10/05/2017 CLINICAL DATA:  Chest pain and shortness of breath.  Empyema EXAM:  PORTABLE CHEST 1 VIEW COMPARISON:  Yesterday FINDINGS: Low volume chest, worse on the left where there is streaky pulmonary opacity and pleural fluid/thickening. Small loculated pneumothorax at the base. 1 of 2 chest tubes are in stable position. Normal heart size for technique. IMPRESSION: Stable postoperative chest on the left. The remaining chest tube is in stable position. Small loculated pneumothorax at the left base. Electronically Signed   By: Monte Fantasia M.D.   On: 10/05/2017 07:38   Dg Chest Port 1 View  Result Date: 10/04/2017 CLINICAL DATA:  Empyema EXAM: PORTABLE CHEST 1 VIEW COMPARISON:  10/03/2017 FINDINGS: Left chest tubes remain in place, unchanged. No pneumothorax. Low lung volumes. Patchy left lung airspace disease is unchanged. No focal opacity on the right. Heart is borderline in size. IMPRESSION: No significant change.  No pneumothorax. Electronically Signed   By: Rolm Baptise M.D.   On: 10/04/2017 07:10   Dg Chest Port 1 View  Result Date: 10/03/2017 CLINICAL DATA:  Empyema. EXAM: PORTABLE CHEST 1 VIEW COMPARISON:  10/02/2017 and CT chest 09/26/2017. FINDINGS: Trachea is midline. Heart is enlarged. Three chest tubes remain in the left hemithorax. There is residual mixed interstitial and airspace opacification in the left perihilar region with small residual loculated pleural fluid. Right lung  is clear. Lungs are low in volume. IMPRESSION: 1. Small residual loculated left pleural fluid collection, stable, with 3 left chest tubes in place. 2. Left perihilar atelectasis/pneumonia persists. Electronically Signed   By: Lorin Picket M.D.   On: 10/03/2017 07:36   Dg Chest Port 1 View  Result Date: 10/02/2017 CLINICAL DATA:  Chest tube.  Empyema. EXAM: PORTABLE CHEST 1 VIEW COMPARISON:  10/01/2017. FINDINGS: Left chest tubes in stable position. Stable left-sided pleural thickening. Stable left pleural thickening. No pneumothorax. Cardiomegaly with normal pulmonary vascularity. Low  lung volumes with basilar atelectatic changes again noted, left side greater than right. No interim change. Heart size stable. IMPRESSION: 1. Left chest tubes in stable position. Stable left pleural thickening. No pneumothorax. 2. Low lung volumes with basilar atelectatic changes, left side greater than right, again noted. 3.  Stable cardiomegaly.  No pulmonary venous congestion. Electronically Signed   By: Marcello Moores  Register   On: 10/02/2017 06:58   Dg Chest Port 1 View  Result Date: 10/01/2017 CLINICAL DATA:  Chest tube EXAM: PORTABLE CHEST 1 VIEW COMPARISON:  09/30/2017 FINDINGS: Left chest tubes remain in place. Very low lung volumes. No visible pneumothorax. Diffuse left lung airspace disease and right base atelectasis. Suspect small left effusion. Mild cardiomegaly. IMPRESSION: Very low lung volumes. Left chest tubes remain in place without visible pneumothorax. Electronically Signed   By: Rolm Baptise M.D.   On: 10/01/2017 07:48   Dg Chest Port 1 View  Result Date: 09/30/2017 CLINICAL DATA:  Status post VATS.  Evaluate for pneumothorax. EXAM: PORTABLE CHEST 1 VIEW COMPARISON:  09/26/2017 FINDINGS: There are 3 left-sided chest tubes. There may be a small residual left-sided hydropneumothorax overlying the lateral aspect of the left lung measuring 6 mm in thickness. Re-expansion atelectasis within the right upper lobe and left lower lobe noted. Right lung is clear. IMPRESSION: 1. Status post left chest tubes placement. Suspect small residual left-sided hydropneumothorax measuring approximately 6 mm in thickness. Electronically Signed   By: Kerby Moors M.D.   On: 09/30/2017 20:38   Dg Chest Port 1 View  Result Date: 09/26/2017 CLINICAL DATA:  Large left pleural effusion. Status post thoracentesis. EXAM: PORTABLE CHEST 1 VIEW COMPARISON:  Prior today FINDINGS: Large left pleural effusion shows slight decrease in size since previous study. There is mildly improved aeration left upper lung field. Right  lung remains clear. No pneumothorax visualized. IMPRESSION: Mild decrease in size of large left pleural effusion. No pneumothorax visualized. Electronically Signed   By: Earle Gell M.D.   On: 09/26/2017 16:27   Dg Chest Port 1 View  Result Date: 09/26/2017 CLINICAL DATA:  Acute onset of shortness of breath. EXAM: PORTABLE CHEST 1 VIEW COMPARISON:  Chest radiograph and CT of the chest performed earlier today at 12:07 a.m. and 12:33 a.m. FINDINGS: A very large left-sided pleural effusion is again noted, with underlying airspace opacification. Underlying vascular congestion is noted. No pneumothorax is seen. The cardiomediastinal silhouette is not well characterized due to the adjacent pleural effusion. No acute osseous abnormalities are identified. IMPRESSION: Very large left-sided pleural effusion again noted, with underlying airspace opacification. Vascular congestion seen. Electronically Signed   By: Garald Balding M.D.   On: 09/26/2017 06:11         Discharge Medications: Allergies as of 10/06/2017      Reactions   Erythromycin Swelling   SWELLING REACTION UNSPECIFIED  "I Cannot take any mycins"   Penicillins Swelling   SWELLING REACTION UNSPECIFIED  "I Cannot take any  cillins" PATIENT HAS HAD A PCN REACTION WITH IMMEDIATE RASH, FACIAL/TONGUE/THROAT SWELLING, SOB, OR LIGHTHEADEDNESS WITH HYPOTENSION:  #  #  YES  #  #  Has patient had a PCN reaction causing severe rash involving mucus membranes or skin necrosis: unk Has patient had a PCN reaction that required hospitalization: unk Has patient had a PCN reaction occurring within the last 10 years: no   Sulfa Antibiotics    UNSPECIFIED REACTION  "Cannot take any sulfa drugs"      Medication List    STOP taking these medications   amLODipine 10 MG tablet Commonly known as:  NORVASC     TAKE these medications   ferrous sulfate 325 (65 FE) MG tablet Take 325 mg by mouth 2 (two) times daily.   gabapentin 300 MG capsule Commonly  known as:  NEURONTIN Take 300 mg by mouth 3 (three) times daily.   ibuprofen 200 MG tablet Commonly known as:  ADVIL,MOTRIN Take 200 mg by mouth every 6 (six) hours as needed. Shoulder pain   levofloxacin 500 MG tablet Commonly known as:  LEVAQUIN Take 1 tablet (500 mg total) by mouth daily.   metoprolol tartrate 25 MG tablet Commonly known as:  LOPRESSOR Take 1 tablet (25 mg total) by mouth 2 (two) times daily.   OLANZapine 15 MG tablet Commonly known as:  ZYPREXA Take 15 mg by mouth at bedtime.   Oxycodone HCl 10 MG Tabs Take 1 tablet (10 mg total) by mouth every 4 (four) hours as needed for severe pain (may take half a tablet for less than severe pain).   QUEtiapine 50 MG tablet Commonly known as:  SEROQUEL Take 50-100 mg by mouth at bedtime.   venlafaxine XR 75 MG 24 hr capsule Commonly known as:  EFFEXOR-XR Take 225 mg by mouth daily.       Follow Up Appointments: Follow-up Information    Melrose Nakayama, MD. Go on 10/27/2017.   Specialty:  Cardiothoracic Surgery Why:  PA/LAT CXR to be taken (at [facility] which is in the same building as Dr. Leonarda Salon office) on 10/27/2017 at 12:45 pm ;Appointment time is at 1:15 pm Contact information: [place] [address] [phone]           Signed: Ellamae Sia 10/06/2017, 7:34 AM

## 2017-10-02 NOTE — Progress Notes (Signed)
2 Days Post-Op Procedure(s) (LRB): VIDEO BRONCHOSCOPY (N/A) DRAINAGE OF PLEURAL EFFUSION (Left) VIDEO ASSISTED THORACOSCOPY (VATS)/DECORTICATION (Left) Subjective: Still c/o incisional pain  Objective: Vital signs in last 24 hours: Temp:  [97.8 F (36.6 C)-98.8 F (37.1 C)] 98.1 F (36.7 C) (06/14 0751) Pulse Rate:  [82-123] 95 (06/14 0600) Cardiac Rhythm: Sinus tachycardia (06/13 2000) Resp:  [14-27] 16 (06/14 0600) BP: (87-146)/(36-86) 125/78 (06/14 0600) SpO2:  [94 %-100 %] 96 % (06/14 0600)  Hemodynamic parameters for last 24 hours:    Intake/Output from previous day: 06/13 0701 - 06/14 0700 In: 2130 [P.O.:360; I.V.:255; IV Piggyback:1129] Out: 2600 [Urine:1900; Chest Tube:700] Intake/Output this shift: No intake/output data recorded.  General appearance: cooperative and no distress Neurologic: intact Heart: regular rate and rhythm Lungs: diminished breath sounds left base Abdomen: normal findings: soft, non-tender no air leak  Lab Results: Recent Labs    10/01/17 0415 10/02/17 0431  WBC 19.9* 15.9*  HGB 7.3* 6.6*  HCT 24.7* 23.3*  PLT 394 408*   BMET:  Recent Labs    10/01/17 0415 10/02/17 0431  NA 137 139  K 4.9 4.2  CL 103 105  CO2 30 29  GLUCOSE 213* 100*  BUN 10 11  CREATININE 0.67 0.69  CALCIUM 7.4* 7.3*    PT/INR:  Recent Labs    09/30/17 0755  LABPROT 17.1*  INR 1.40   ABG    Component Value Date/Time   PHART 7.374 10/01/2017 0340   HCO3 29.0 (H) 10/01/2017 0340   TCO2 31 09/30/2017 1914   O2SAT 97.6 10/01/2017 0340   CBG (last 3)  Recent Labs    10/01/17 1152 10/01/17 2312 10/02/17 0545  GLUCAP 163* 141* 84    Assessment/Plan: S/P Procedure(s) (LRB): VIDEO BRONCHOSCOPY (N/A) DRAINAGE OF PLEURAL EFFUSION (Left) VIDEO ASSISTED THORACOSCOPY (VATS)/DECORTICATION (Left) -CV- stable  RESP- s/p decortication for empyema  preop cultures grew MSSA and beta strep  intraop cultures pending  Continue IS, flutter  ID-  continue vanco, ceftriaxone and flagyl pending culture result  Anemia- transfuse for Hgb 6.6  ENDO- CBG OK  Psych- no issues currently  transfer to [place]   LOS: 6 days    Melrose Nakayama 10/02/2017

## 2017-10-02 NOTE — Progress Notes (Signed)
      [address]       [place] [address]             [phone]      Resting comfortably  Ambulated earlier  BP 107/65 (BP Location: Left Arm)   Pulse 85   Temp 98 F (36.7 C) (Oral)   Resp 14   Ht 5\' 9"  (1.753 m)   Wt 220 lb 7.4 oz (100 kg)   SpO2 97%   BMI 32.56 kg/m   Awaiting step down bed  Remo Lipps C. Roxan Hockey, MD [facility] [phone]

## 2017-10-02 NOTE — Progress Notes (Signed)
CRITICAL VALUE ALERT  Critical Value:  Hemoglobin 6.6  Date & Time Notied: 10/02/17 7583 Provider Notified: Dr Prescott Gum Orders Received/Actions taken: Paged, awaiting call back

## 2017-10-02 NOTE — Progress Notes (Signed)
1 unit PRBC ordered per Dr Prescott Gum for critical hemoglobin of 6.6.

## 2017-10-02 NOTE — Op Note (Signed)
NAME: Jeremy Dillon, Jeremy Dillon MEDICAL RECORD [id number] ACCOUNT [id number] DATE OF BIRTH:1970-12-03 FACILITY: [facility] LOCATION: [facility] PHYSICIAN:Avanell Banwart Chaya Jan, MD  OPERATIVE REPORT  DATE OF PROCEDURE:  09/30/2017  PREOPERATIVE DIAGNOSIS:  Left empyema.  POSTOPERATIVE DIAGNOSIS:  Left empyema.  PROCEDURE:  Bronchoscopy, Left video-assisted thoracoscopy, Drainage of empyema, Visceral and parietal pleural decortication.  SURGEON:  Modesto Charon, MD  ASSISTANT:  Lars Pinks, PA-C   ANESTHESIA:  General.  FINDINGS:  Large empyema with foul-smelling murky fluid and extensive gelatinous debris, extensive exudate, and visceral peel. Very thick fibrous parietal pleural peel.  Good reexpansion of upper and lower lobes post-decortication.  CLINICAL NOTE:  The patient is a 47 year old gentleman with a past history of tobacco abuse, schizophrenia, bipolar disorder, reflux, and pneumonia.  He presented with a 2-day history of shortness of breath with minimal activity and pleuritic chest and  back pain.  He denied any prodromal illness prior to that.  He was noted to be tachycardic, and chest x-ray showed a large pleural effusion.  A CT showed the effusion was multiloculated.  He was started on empiric antibiotics.  Thoracentesis drained 1.4  L of fluid, which was exudative.  Cultures grew out Staph aureus and strep species.  He was advised to undergo left VATS for decortication and also bronchoscopy to rule out an endobronchial lesion at the same setting.  The indications, risks, benefits,  and alternatives were discussed in detail with the patient.  He understood and accepted the risks and agreed to proceed.  OPERATIVE NOTE:  The patient was brought to the operating room on 10/01/2011.  Intravenous access was established and an arterial blood pressure monitoring line was placed by anesthesia.  He was anesthetized and intubated.  Intravenous vancomycin was  administered.  Sequential  compression devices were placed on the calves for DVT prophylaxis.  A Foley catheter was placed.  He was placed in a right lateral decubitus position, and the left chest was prepped and draped in the usual sterile fashion.   Single-lung ventilation of the right lung was initiated and maintained without difficulty throughout the procedure.  After performing a timeout, an incision was made in the 7th interspace in the midaxillary line.  This was a port-type incision.  Dissection was carried down to the chest, and the chest was entered.  There was a thick fibrous peel.  Initially, it was unclear if this  was an extrapleural plane.  A port was placed, but there was blood obscuring any visualization with the scope.  A 6 cm working incision was made in the 5th interspace anterolaterally.  No rib spreading was performed during the procedure.  The incision was  carried through the intercostal muscles.  Again, thick fibrous tissue was encountered.  Blunt dissection with a finger was used to connect the space between the 2 incisions and then to dissect out from the incision from there.  A sucker was advanced into  the chest, and multiple pockets of murky fluid were encountered.  Posteriorly, a large pocket containing well over a liter of foul-smelling murky fluid was evacuated.  The thoracoscope was then advanced into this area.  There was extensive gelatinous  and fibrinous debris.  This was evacuated.  This took a considerable period of time.  The fibrous tissue that had been encountered in the incisions initially was found to be the parietal pleural peel, and it was fibrous throughout the area that was  involved with the empyema.  Anteriorly and superiorly, there was no parietal pleural  reaction.  There was some gelatinous debris in this area that was removed, as well as fluid that was less inflammatory in nature.  Posteriorly, the entire lower  lobe and approximately half of the upper lobe were encased in a  pleural peel.  This stripped off relatively easily in some areas and with moderate difficulty in others.  It was much less fibrous than the peel on the parietal pleura.  Along the edges of  the lungs, there were some pleural tears both in the lingula and the inferior aspect of the lower lobe.  These areas were removed with a stapler.  An Echelon stapler with gold cartridges was used.  There was extensive fluid and debris within the fissure  that was evacuated.  The parietal pleural decortication then was performed.  Again, this was a very thick peel, and there was extensive bleeding with removal of the peel.  The chest was copiously irrigated with warm saline on multiple occasions, and clot  was evacuated.  The estimated blood loss was 400 mL.  After removing the parietal peel, the chest was filled with saline.  A test inflation showed no significant air leakage.  There was good reexpansion of both the upper and lower lobes.  Three  32-French Blake drains then were placed, 1 posteriorly towards the apex, 1 along the diaphragmatic surface, and 1 laterally towards the apex.  These were all secured with #1 silk sutures.  Dual-lung ventilation was resumed.  The working incision was  closed in 3 layers.  The patient was extubated in the operating room and taken to the postanesthetic care unit in fair condition.  LN/NUANCE  D:10/01/2017 T:10/02/2017 JOB:[id number]

## 2017-10-03 ENCOUNTER — Inpatient Hospital Stay (HOSPITAL_COMMUNITY): Payer: PPO

## 2017-10-03 LAB — TYPE AND SCREEN
ABO/RH(D): O POS
Antibody Screen: NEGATIVE
Unit division: 0
Unit division: 0

## 2017-10-03 LAB — BASIC METABOLIC PANEL
ANION GAP: 6 (ref 5–15)
BUN: 6 mg/dL (ref 6–20)
CHLORIDE: 104 mmol/L (ref 101–111)
CO2: 29 mmol/L (ref 22–32)
CREATININE: 0.59 mg/dL — AB (ref 0.61–1.24)
Calcium: 7.5 mg/dL — ABNORMAL LOW (ref 8.9–10.3)
GFR calc non Af Amer: >60 mL/min (ref >60)
Glucose, Bld: 105 mg/dL — ABNORMAL HIGH (ref 65–99)
POTASSIUM: 3.7 mmol/L (ref 3.5–5.1)
SODIUM: 139 mmol/L (ref 135–145)

## 2017-10-03 LAB — GLUCOSE, CAPILLARY
GLUCOSE-CAPILLARY: 106 mg/dL — AB (ref 65–99)
GLUCOSE-CAPILLARY: 137 mg/dL — AB (ref 65–99)
Glucose-Capillary: 87 mg/dL (ref 65–99)
Glucose-Capillary: 126 mg/dL — ABNORMAL HIGH (ref 65–99)

## 2017-10-03 LAB — CBC
HEMATOCRIT: 25.7 % — AB (ref 39.0–52.0)
HEMOGLOBIN: 7.7 g/dL — AB (ref 13.0–17.0)
MCH: 24.2 pg — ABNORMAL LOW (ref 26.0–34.0)
MCHC: 30 g/dL (ref 30.0–36.0)
MCV: 80.8 fL (ref 78.0–100.0)
Platelets: 477 10*3/uL — ABNORMAL HIGH (ref 150–400)
RBC: 3.18 MIL/uL — AB (ref 4.22–5.81)
RDW: 20.4 % — ABNORMAL HIGH (ref 11.5–15.5)
WBC: 14.1 10*3/uL — AB (ref 4.0–10.5)

## 2017-10-03 LAB — BPAM RBC
BLOOD PRODUCT EXPIRATION DATE: 201907112359
Blood Product Expiration Date: 201907112359
ISSUE DATE / TIME: 201906140911
ISSUE DATE / TIME: 201906121614
Unit Type and Rh: 5100
Unit Type and Rh: 5100

## 2017-10-03 NOTE — Progress Notes (Signed)
3 Days Post-Op Procedure(s) (LRB): VIDEO BRONCHOSCOPY (N/A) DRAINAGE OF PLEURAL EFFUSION (Left) VIDEO ASSISTED THORACOSCOPY (VATS)/DECORTICATION (Left) Subjective: Some incisional pain  Objective: Vital signs in last 24 hours: Temp:  [97.4 F (36.3 C)-98.7 F (37.1 C)] 98.7 F (37.1 C) (06/15 0756) Pulse Rate:  [79-118] 107 (06/15 0756) Cardiac Rhythm: Normal sinus rhythm;Other (Comment) (06/15 0700) Resp:  [14-26] 20 (06/15 0858) BP: (102-142)/(63-82) 106/75 (06/15 0756) SpO2:  [90 %-100 %] 94 % (06/15 0858) Weight:  [230 lb 9.6 oz (104.6 kg)] 230 lb 9.6 oz (104.6 kg) (06/14 2053)  Hemodynamic parameters for last 24 hours:    Intake/Output from previous day: 06/14 0701 - 06/15 0700 In: 2080.7 [P.O.:240; I.V.:220; Blood:315; IV Piggyback:1305.7] Out: 2600 [Urine:2400; Chest Tube:200] Intake/Output this shift: No intake/output data recorded.  General appearance: alert, cooperative and no distress Neurologic: intact Heart: tachy, regular Lungs: diminished breath sounds left base Abdomen: normal findings: soft, non-tender no air leak, serous fluid from CT  Lab Results: Recent Labs    10/02/17 0431 10/02/17 1347 10/03/17 0735  WBC 15.9*  --  14.1*  HGB 6.6* 7.9* 7.7*  HCT 23.3* 26.7* 25.7*  PLT 408*  --  477*   BMET:  Recent Labs    10/02/17 0431 10/03/17 0735  NA 139 139  K 4.2 3.7  CL 105 104  CO2 29 29  GLUCOSE 100* 105*  BUN 11 6  CREATININE 0.69 0.59*  CALCIUM 7.3* 7.5*    PT/INR: No results for input(s): LABPROT, INR in the last 72 hours. ABG    Component Value Date/Time   PHART 7.374 10/01/2017 0340   HCO3 29.0 (H) 10/01/2017 0340   TCO2 31 09/30/2017 1914   O2SAT 97.6 10/01/2017 0340   CBG (last 3)  Recent Labs    10/02/17 1612 10/02/17 2135 10/03/17 0754  GLUCAP 79 137* 106*    Assessment/Plan: S/P Procedure(s) (LRB): VIDEO BRONCHOSCOPY (N/A) DRAINAGE OF PLEURAL EFFUSION (Left) VIDEO ASSISTED THORACOSCOPY (VATS)/DECORTICATION  (Left) -CV- sinus tachy  RESP- s/p decortication for empyema-   No air leak, drainage trending down- dc anterior CT   ID- on ceftriaxone, intraop Cx with unidentified GNR- dc vanco, continue ceftriaxone and flagyl for now  RENAL- OK  Anemia secondary to ABL in setting of chronic anemia- better after transfusion  SCD + enoxaparin for DVT prophylaxis   LOS: 7 days    Melrose Nakayama 10/03/2017

## 2017-10-04 ENCOUNTER — Inpatient Hospital Stay (HOSPITAL_COMMUNITY): Payer: PPO

## 2017-10-04 LAB — GLUCOSE, CAPILLARY
GLUCOSE-CAPILLARY: 100 mg/dL — AB (ref 65–99)
Glucose-Capillary: 120 mg/dL — ABNORMAL HIGH (ref 65–99)
Glucose-Capillary: 151 mg/dL — ABNORMAL HIGH (ref 65–99)
Glucose-Capillary: 127 mg/dL — ABNORMAL HIGH (ref 65–99)

## 2017-10-04 MED ORDER — LEVOFLOXACIN 500 MG PO TABS
500.0000 mg | ORAL_TABLET | Freq: Every day | ORAL | Status: DC
Start: 2017-10-04 — End: 2017-10-06
  Administered 2017-10-04 – 2017-10-06 (×3): 500 mg via ORAL
  Filled 2017-10-04 (×3): qty 1

## 2017-10-04 NOTE — Progress Notes (Signed)
4 Days Post-Op Procedure(s) (LRB): VIDEO BRONCHOSCOPY (N/A) DRAINAGE OF PLEURAL EFFUSION (Left) VIDEO ASSISTED THORACOSCOPY (VATS)/DECORTICATION (Left) Subjective: Feels well anxious to go home  Objective: Vital signs in last 24 hours: Temp:  [98 F (36.7 C)-99 F (37.2 C)] 98.3 F (36.8 C) (06/16 0747) Pulse Rate:  [82-110] 91 (06/16 0747) Cardiac Rhythm: Normal sinus rhythm (06/16 0702) Resp:  [13-32] 19 (06/16 0834) BP: (106-132)/(65-88) 128/78 (06/16 0747) SpO2:  [92 %-99 %] 98 % (06/16 0834)  Hemodynamic parameters for last 24 hours:    Intake/Output from previous day: 06/15 0701 - 06/16 0700 In: 1920 [P.O.:1680; I.V.:240] Out: 4325 [Urine:3545; Chest Tube:780] Intake/Output this shift: No intake/output data recorded.  General appearance: alert, cooperative and no distress Neurologic: intact Heart: regular rate and rhythm Lungs: diminished breath sounds on left serous drainage from CT  Lab Results: Recent Labs    10/02/17 0431 10/02/17 1347 10/03/17 0735  WBC 15.9*  --  14.1*  HGB 6.6* 7.9* 7.7*  HCT 23.3* 26.7* 25.7*  PLT 408*  --  477*   BMET:  Recent Labs    10/02/17 0431 10/03/17 0735  NA 139 139  K 4.2 3.7  CL 105 104  CO2 29 29  GLUCOSE 100* 105*  BUN 11 6  CREATININE 0.69 0.59*  CALCIUM 7.3* 7.5*    PT/INR: No results for input(s): LABPROT, INR in the last 72 hours. ABG    Component Value Date/Time   PHART 7.374 10/01/2017 0340   HCO3 29.0 (H) 10/01/2017 0340   TCO2 31 09/30/2017 1914   O2SAT 97.6 10/01/2017 0340   CBG (last 3)  Recent Labs    10/03/17 1730 10/03/17 2103 10/04/17 0745  GLUCAP 87 126* 100*    Assessment/Plan: S/P Procedure(s) (LRB): VIDEO BRONCHOSCOPY (N/A) DRAINAGE OF PLEURAL EFFUSION (Left) VIDEO ASSISTED THORACOSCOPY (VATS)/DECORTICATION (Left) -Doing well 700 ml of drainage from CT recorded overnight. Looking at pleuravac line indicate ~70 ml Will dc middle chest tube Keep posterior tube on water  seal Ambulate OR cultures growing same beta strep and S. Aureus- both sensitive to quinolones- will change to levaquin    LOS: 8 days    Melrose Nakayama 10/04/2017

## 2017-10-05 ENCOUNTER — Inpatient Hospital Stay (HOSPITAL_COMMUNITY): Payer: PPO

## 2017-10-05 LAB — AEROBIC/ANAEROBIC CULTURE (SURGICAL/DEEP WOUND): CULTURE: NO GROWTH

## 2017-10-05 LAB — GLUCOSE, CAPILLARY: Glucose-Capillary: 102 mg/dL — ABNORMAL HIGH (ref 65–99)

## 2017-10-05 LAB — AEROBIC/ANAEROBIC CULTURE W GRAM STAIN (SURGICAL/DEEP WOUND)

## 2017-10-05 MED ORDER — FENTANYL 40 MCG/ML IV SOLN: INTRAVENOUS | Status: AC

## 2017-10-05 NOTE — Consult Note (Signed)
[facility] CM Primary Care Navigator  10/05/2017  Jeremy Dillon 11/20/1970 [id number]   Met withpatientat the bedside toidentify possible discharge needs. Patientreportsthat he was"very short of breath" thathad ledto thisadmission. (left empyema, loculated left pleural effusion status post bronchoscopy, left VATS- video-assisted thoracoscopy and drainage of empyema)  Patientendorses Dr. Slatoski with [facility] ([place]) ashisprimary care provider.   [facility] [place] [facility] so far.  Patientreports thathehas beenmanaginghis ownmedications at home with use of "pill box" system filled once a week.  Patient verbalizedthat he was driving prior to admission, but if needed, his mother (Jeremy Dillon) provides transportation to his doctors' appointments.  Patient states that his mother will be his primary caregiver athome after discharge.  Anticipated plan for dischargeis home per patient.  Patientvoiced understandingto callprimarycareprovider'soffice when hereturnshome,for a post discharge follow-upvisitwithin1- 2 weeksor sooner if needs arise.Patient letter (with PCP's contact number) was provided asareminder.   Discussed with patientregarding [facility] CM services available for health managementandresourcesat homebut he denies any needs or concerns at this point. Heverbalizedunderstandingto seekreferral from primary care provider to [facility] care management ifdeemed necessary and appropriatefor anyservicesin thefuture.  [facility] care management information was provided for futureneedsthat he may have.  Patient hadverbally agreedand optedforEMMIcalls tofollow-upwith his  recoveryat home.   Referral made for EMMI General calls after discharge.   For additional questions please contact:   A. , BSN, RN-BC [facility]  Navigator Cell: [phone] 

## 2017-10-05 NOTE — Progress Notes (Signed)
Pt c/o left forearm pain. Appears to be a DC IV site. Firm, red, warm to touch. RN outlined firmness. No numbness and weakness noted. 2+ radial and brachial pulses. Placed warm pack on forearm which patient states helped with pain. Will continue to monitor.     DC fentanyl PCA per order. Pt received 13mcg prior to DC. Waste 25mL fentanyl in sink with Theodora Blow, RN. DC IVF. PIV SL.

## 2017-10-05 NOTE — Progress Notes (Addendum)
      [address]       [place] [address]             [phone]      5 Days Post-Op Procedure(s) (LRB): VIDEO BRONCHOSCOPY (N/A) DRAINAGE OF PLEURAL EFFUSION (Left) VIDEO ASSISTED THORACOSCOPY (VATS)/DECORTICATION (Left)   Subjective:  Complaints of pain at chest tube sites.  Improved some with removal of chest tubes.  Some shortness of breath, but is improving overall.  Objective: Vital signs in last 24 hours: Temp:  [97.9 F (36.6 C)-98.8 F (37.1 C)] 97.9 F (36.6 C) (06/17 0409) Pulse Rate:  [69-113] 84 (06/17 0409) Cardiac Rhythm: Normal sinus rhythm (06/17 0700) Resp:  [12-26] 18 (06/17 0730) BP: (100-133)/(56-83) 127/82 (06/17 0409) SpO2:  [92 %-98 %] 97 % (06/17 0730)  Intake/Output from previous day: 06/16 0701 - 06/17 0700 In: 2040 [P.O.:1800; I.V.:240] Out: 3905 [Urine:3775; Chest Tube:130]  General appearance: alert, cooperative and no distress Heart: regular rate and rhythm Lungs: diminished breath sounds on left Abdomen: soft, non-tender; bowel sounds normal; no masses,  no organomegaly Extremities: extremities normal, atraumatic, no cyanosis or edema Wound: clean and dry  Lab Results: Recent Labs    10/02/17 1347 10/03/17 0735  WBC  --  14.1*  HGB 7.9* 7.7*  HCT 26.7* 25.7*  PLT  --  477*   BMET:  Recent Labs    10/03/17 0735  NA 139  K 3.7  CL 104  CO2 29  GLUCOSE 105*  BUN 6  CREATININE 0.59*  CALCIUM 7.5*    PT/INR: No results for input(s): LABPROT, INR in the last 72 hours. ABG    Component Value Date/Time   PHART 7.374 10/01/2017 0340   HCO3 29.0 (H) 10/01/2017 0340   TCO2 31 09/30/2017 1914   O2SAT 97.6 10/01/2017 0340   CBG (last 3)  Recent Labs    10/04/17 1216 10/04/17 1703 10/04/17 2109  GLUCAP 120* 151* 127*    Assessment/Plan: S/P Procedure(s) (LRB): VIDEO BRONCHOSCOPY (N/A) DRAINAGE OF PLEURAL EFFUSION (Left) VIDEO ASSISTED THORACOSCOPY (VATS)/DECORTICATION (Left)  1. Chest tube-  if accurate 130 ml of output recorded yesterday, no air leak appreciated- can likely d/c chest tube today vs tomorrow 2. Pulm- CXR with effusion on left, stable post surgical changes 3. ID- Empyema, afebrile, cultures grown staph and strep- ABX changed per sensitivities will continue 4. CV- hemodynamically stable, tachycardic at times continue Lopressor 5. Dispo- patient stable, chest tube output 130 cc for last 24 hours, possibly remove today vs tomorrow, continue ABX   LOS: 9 days    Jeremy Dillon 10/05/2017 DC chest tube On levaquin PO due to PCN allergy Plan 4 weeks total of antibiotics DC PCA  Jeremy Dillon C. Roxan Hockey, MD [facility] [phone]

## 2017-10-05 NOTE — Care Management Important Message (Signed)
Important Message  Patient Details  Name: Jeremy Dillon MRN: [id number] Date of Birth: 1970-04-27   Medicare Important Message Given:  Yes    Myking Sar P Arthur 10/05/2017, 4:10 PM

## 2017-10-06 ENCOUNTER — Inpatient Hospital Stay (HOSPITAL_COMMUNITY): Payer: PPO

## 2017-10-06 LAB — AEROBIC/ANAEROBIC CULTURE W GRAM STAIN (SURGICAL/DEEP WOUND)

## 2017-10-06 LAB — AEROBIC/ANAEROBIC CULTURE (SURGICAL/DEEP WOUND)

## 2017-10-06 MED ORDER — METOPROLOL TARTRATE 25 MG PO TABS: 25.0000 mg | ORAL_TABLET | Freq: Two times a day (BID) | ORAL | 3 refills | Status: DC

## 2017-10-06 MED ORDER — LEVOFLOXACIN 500 MG PO TABS: 500.0000 mg | ORAL_TABLET | Freq: Every day | ORAL | 0 refills | Status: AC

## 2017-10-06 MED ORDER — OXYCODONE HCL 10 MG PO TABS: 10.0000 mg | ORAL_TABLET | ORAL | 0 refills | Status: DC | PRN

## 2017-10-06 NOTE — Progress Notes (Signed)
Patient discharged home with family. Paperwork reviewed, personal belongings packed. IV removed and patient wheeled to front entrance.

## 2017-10-06 NOTE — Progress Notes (Signed)
Patient refusing CBG

## 2017-10-06 NOTE — Progress Notes (Addendum)
      [address]       [place] [address]             [phone]      6 Days Post-Op Procedure(s) (LRB): VIDEO BRONCHOSCOPY (N/A) DRAINAGE OF PLEURAL EFFUSION (Left) VIDEO ASSISTED THORACOSCOPY (VATS)/DECORTICATION (Left)   Subjective:  Patient is doing okay.  He does have pain in his right forearm from old IV site that appears to be infiltrated.  He denies chest pain and shortness of breath.  Nursing notes of decreased saturations with sleep noted.  He is falling asleep during conversation.  Objective: Vital signs in last 24 hours: Temp:  [97.5 F (36.4 C)-98.9 F (37.2 C)] 98.9 F (37.2 C) (06/17 2316) Pulse Rate:  [96-126] 96 (06/17 2316) Cardiac Rhythm: Sinus tachycardia (06/18 0700) Resp:  [15-23] 15 (06/17 2316) BP: (119-136)/(63-90) 119/63 (06/17 2316) SpO2:  [91 %-100 %] 100 % (06/17 2316)  Intake/Output from previous day: 06/17 0701 - 06/18 0700 In: 1550 [P.O.:1550] Out: 4810 [Urine:4810]  General appearance: alert, cooperative and no distress Heart: regular rate and rhythm Lungs: diminished breath sounds LLL Abdomen: soft, non-tender; bowel sounds normal; no masses,  no organomegaly Extremities: extremities normal, atraumatic, no cyanosis or edema Wound: clean and dry  Lab Results: Recent Labs    10/03/17 0735  WBC 14.1*  HGB 7.7*  HCT 25.7*  PLT 477*   BMET:  Recent Labs    10/03/17 0735  NA 139  K 3.7  CL 104  CO2 29  GLUCOSE 105*  BUN 6  CREATININE 0.59*  CALCIUM 7.5*    PT/INR: No results for input(s): LABPROT, INR in the last 72 hours. ABG    Component Value Date/Time   PHART 7.374 10/01/2017 0340   HCO3 29.0 (H) 10/01/2017 0340   TCO2 31 09/30/2017 1914   O2SAT 97.6 10/01/2017 0340   CBG (last 3)  Recent Labs    10/04/17 1703 10/04/17 2109 10/05/17 0751  GLUCAP 151* 127* 102*    Assessment/Plan: S/P Procedure(s) (LRB): VIDEO BRONCHOSCOPY (N/A) DRAINAGE OF PLEURAL EFFUSION (Left) VIDEO ASSISTED  THORACOSCOPY (VATS)/DECORTICATION (Left)  1. Pulm- chest tube removed yesterday, CXR is free from pneumothorax, post surgical scarring remains present, should resolve over next several weeks... Decreased oxygen saturations at night noted per nursing, will arrange home oxygen, will need sleep study on outpatient basis as he likely has Apnea 2. ID- Empyema, remains afebrile, OR Cultures grew Staph, and Strep-- will continue Levaquin at discharge as patient is PCN Allergic 3. CV-hemodynamically stable 4. Infiltration at IV site, instructed patient to continue warm compress as needed, on ABX which should cover any developing infection 5. dispo- patient stable, will d/c home today   LOS: 10 days    Ellwood Handler 10/06/2017 Patient seen and examined, agree with above Continue levaquin another 3 weeks  Remo Lipps C. Roxan Hockey, MD [facility] [phone]

## 2017-10-06 NOTE — Progress Notes (Signed)
Patient placed on oxygen over night saturations drop to 70's he appears to have sleep apnea but would need a sleep study evaluation to determine and to recommend CPAP for at night. I will continue to monitor.

## 2017-10-06 NOTE — Progress Notes (Signed)
SATURATION QUALIFICATIONS: (This note is used to comply with regulatory documentation for home oxygen)  Patient Saturations on Room Air at Rest= 95%  Patient Saturations on Room Air while Ambulating= 91-98%  Patient Saturations on  Liters of oxygen while Ambulating = % N/A  Please briefly explain why patient needs home oxygen: Patient does not require O2 during ambulation. Patient requires O2 at night for desat.

## 2017-10-06 NOTE — Care Management Note (Addendum)
Case Management Note Previous Note Created by Marvetta Gibbons RN,BSN [facility]22 Case Manager  [phone]  Patient Details  Name: Jeremy Dillon MRN: [id number] Date of Birth: 27-May-1970  Subjective/Objective:  PT admitted with acute resp. Failure with large left pl. Effusion. tx from [facility]- dx of loculated effusion now s/p VIDEO BRONCHOSCOPY  DRAINAGE OF PLEURAL EFFUSION (Left) VIDEO ASSISTED THORACOSCOPY (VATS)/DECORTICATION (Left) on 09/30/17             Action/Plan: PTA pt lived at home, was independent, CM to follow for transition of care needs  Expected Discharge Date:                 Expected Discharge Plan:     In-House Referral:     Discharge planning Services  CM Consult  Post Acute Care Choice:    Choice offered to:     DME Arranged:    DME Agency:     HH Arranged:    [place] Agency:     Status of Service:  In process, will continue to follow  If discussed at Long Length of Stay Meetings, dates discussed:    Discharge Disposition:   Additional Comments: 10/06/2017 Pt to discharge home today.  Per attending group pt needs sleep study - CM contacted pts PCP Dr Rayford Halsted office and requested office to arrange sleep study.  Office informed CM that referral would be sent to Dr Alcide Clever with [facility] today and study would be scheduled.  Clinic to contact pt directly.  CM verified with [facility] that night oxygen qualification requires overnight oximetry test (not yet performed) or pt will need to pay for oxygen out of pocket.  Pt informed CM that he wants to discharge home today and declined overnight oximetry test.    Pt refusing DME for home oxygen at night as ordered - pt stated he will just wait for sleep study - "I don't need continuous oxygen at night".  Attending group made aware of above barriers with arranging home oxygen, pt still deemed by group to be ready for discharge today without home oxygen  [name], RN 10/06/2017, 11:02  AM

## 2017-10-09 DIAGNOSIS — J869 Pyothorax without fistula: Secondary | ICD-10-CM | POA: Diagnosis not present

## 2017-10-26 ENCOUNTER — Other Ambulatory Visit: Payer: Self-pay | Admitting: *Deleted

## 2017-10-26 DIAGNOSIS — J869 Pyothorax without fistula: Secondary | ICD-10-CM

## 2017-10-27 ENCOUNTER — Other Ambulatory Visit: Payer: Self-pay

## 2017-10-27 ENCOUNTER — Encounter: Payer: Self-pay | Admitting: Thoracic Surgery (Cardiothoracic Vascular Surgery)

## 2017-10-27 ENCOUNTER — Ambulatory Visit
Admission: RE | Admit: 2017-10-27 | Discharge: 2017-10-27 | Disposition: A | Discharge status: Home / Self Care | Payer: PPO | Source: Ambulatory Visit | Attending: Thoracic Surgery (Cardiothoracic Vascular Surgery) | Admitting: Thoracic Surgery (Cardiothoracic Vascular Surgery)

## 2017-10-27 ENCOUNTER — Ambulatory Visit (INDEPENDENT_AMBULATORY_CARE_PROVIDER_SITE_OTHER): Payer: PPO | Admitting: Thoracic Surgery (Cardiothoracic Vascular Surgery)

## 2017-10-27 VITALS — BP 117/85 | HR 119 | Temp 98.3°F | Resp 18 | Ht 69.0 in | Wt 210.0 lb

## 2017-10-27 DIAGNOSIS — J90 Pleural effusion, not elsewhere classified: Secondary | ICD-10-CM

## 2017-10-27 DIAGNOSIS — Z9889 Other specified postprocedural states: Secondary | ICD-10-CM

## 2017-10-27 DIAGNOSIS — J869 Pyothorax without fistula: Secondary | ICD-10-CM

## 2017-10-27 DIAGNOSIS — J929 Pleural plaque without asbestos: Secondary | ICD-10-CM | POA: Diagnosis not present

## 2017-10-27 NOTE — Progress Notes (Signed)
[address]       [place] [address]             [phone]     HPI: [name] returns for scheduled postoperative follow-up visit  Jeremy Dillon is a 47 year old man is a 47 year old man with a history of tobacco abuse, hypertension, bipolar disorder, schizophrenia, gastroesophageal reflux, anxiety, and pneumonia.  He presented in June with a 2-day history of shortness of breath and pleuritic back and left chest pain.  He was found to have a large left pleural effusion which was multiloculated.  Thoracentesis drained 1.4 L of fluid which was consistent with an empyema.  I did a left VATS for drainage of the empyema and decortication on 09/30/2017.  Postoperatively he did well and was discharged on day 6.  He was sent home on Levaquin.  He still has 3 days left before he completes his course of that.  He feels well.  His breathing is much improved.  He is not having any incisional pain.  He denies any fevers, chills, or sweats.   Past Medical History:  Diagnosis Date  . Anemia   . Anxiety   . Bipolar disorder ([place])   . Depression   . GERD (gastroesophageal reflux disease)   . Hypertension   . Pneumonia   . Schizophrenia ([place])   . Smoking history 09/25/2017   vapes    Current Outpatient Medications  Medication Sig Dispense Refill  . ferrous sulfate 325 (65 FE) MG tablet Take 325 mg by mouth 2 (two) times daily.  2  . gabapentin (NEURONTIN) 300 MG capsule Take 300 mg by mouth 3 (three) times daily.  1  . ibuprofen (ADVIL,MOTRIN) 200 MG tablet Take 200 mg by mouth every 6 (six) hours as needed. Shoulder pain    . levofloxacin (LEVAQUIN) 500 MG tablet Take 1 tablet (500 mg total) by mouth daily. 30 tablet 0  . metoprolol tartrate (LOPRESSOR) 25 MG tablet Take 1 tablet (25 mg total) by mouth 2 (two) times daily. 60 tablet 3  . OLANZapine (ZYPREXA) 15 MG tablet Take 15 mg by mouth at bedtime.  1  . QUEtiapine (SEROQUEL) 50 MG tablet Take 50-100 mg by mouth  at bedtime.  1  . venlafaxine XR (EFFEXOR-XR) 75 MG 24 hr capsule Take 225 mg by mouth daily.  1   No current facility-administered medications for this visit.     Physical Exam BP 117/85 (BP Location: Right Arm, Patient Position: Sitting, Cuff Size: Large)   Pulse (!) 119   Temp 98.3 F (36.8 C) (Oral)   Resp 18   Ht 5\' 9"  (1.753 m)   Wt 210 lb (95.3 kg)   SpO2 97% Comment: ON RA  BMI 31.77 kg/m  47 year old man in no acute distress Alert and oriented x3 with no focal deficits Cardiac tachycardic and regular Lungs diminished at left base, otherwise clear Incisions healing well  Diagnostic Tests: CHEST - 2 VIEW  COMPARISON:  PA and lateral chest x-ray of October 06, 2017  FINDINGS: The right lung is adequately inflated and clear. On the left there is mild pleural thickening and/or loculated pleural fluid along the inferolateral aspect of the pleural space. The interstitial markings in the left lung have decreased. The heart and pulmonary vascularity are normal. The mediastinum is normal in width.  IMPRESSION: Persistent pleural thickening and/or pleural fluid at the left lung base similar to that seen on the previous study. Interval improvement in  the appearance of the pulmonary interstitium of the left lung.   Electronically Signed   By: David  Martinique M.D.   On: 10/27/2017 13:03 I personally reviewed the chest x-ray images and concur with the findings noted above.  Impression: Jeremy Dillon is a 47 year old gentleman who presented with left-sided chest pain and shortness of breath.  He was found to have a large left pleural effusion with almost complete whiteout of the left chest.  This turned out to be an empyema.  I did a left VATS for drainage of the empyema and decortication on 09/30/2017.  He is now about a month out from surgery and is doing well.  He has minimal discomfort.  He is not taking any narcotics.  His breathing is significantly improved from  preop.  His chest x-ray today shows some residual pleural thickening/effusion.  He does have a history of tobacco abuse and his lung was completely atelectatic on his previous CT.  Plan to do a CT in about 2 months to get a better look at that residual pleural-based opacity on the chest x-ray.  I want to give enough time for his postoperative changes to have resolved.  Tachycardia-was started on Lopressor in the hospital.  He is only on a low-dose of 25 mg twice daily.  He is asymptomatic at present.  Going to hold off on any further medication adjustments and will reassess that in 2 months.  Plan: Return in 2 months with CT chest  Melrose Nakayama, MD [facility] [phone]

## 2017-11-02 DIAGNOSIS — F251 Schizoaffective disorder, depressive type: Secondary | ICD-10-CM | POA: Diagnosis not present

## 2017-11-23 DIAGNOSIS — F251 Schizoaffective disorder, depressive type: Secondary | ICD-10-CM | POA: Diagnosis not present

## 2017-11-26 ENCOUNTER — Other Ambulatory Visit: Payer: Self-pay | Admitting: *Deleted

## 2017-11-26 DIAGNOSIS — J869 Pyothorax without fistula: Secondary | ICD-10-CM

## 2017-11-26 DIAGNOSIS — Z9889 Other specified postprocedural states: Secondary | ICD-10-CM

## 2017-12-07 DIAGNOSIS — R0602 Shortness of breath: Secondary | ICD-10-CM | POA: Diagnosis not present

## 2017-12-07 DIAGNOSIS — R0789 Other chest pain: Secondary | ICD-10-CM | POA: Diagnosis not present

## 2017-12-07 DIAGNOSIS — I10 Essential (primary) hypertension: Secondary | ICD-10-CM | POA: Diagnosis not present

## 2017-12-26 ENCOUNTER — Other Ambulatory Visit: Payer: Self-pay | Admitting: Physician Assistant

## 2017-12-29 ENCOUNTER — Other Ambulatory Visit: Payer: Self-pay | Admitting: Physician Assistant

## 2017-12-29 ENCOUNTER — Encounter: Payer: PPO | Admitting: Thoracic Surgery (Cardiothoracic Vascular Surgery)

## 2017-12-29 ENCOUNTER — Other Ambulatory Visit: Payer: PPO

## 2018-01-18 DIAGNOSIS — F251 Schizoaffective disorder, depressive type: Secondary | ICD-10-CM | POA: Diagnosis not present

## 2018-02-11 DIAGNOSIS — I10 Essential (primary) hypertension: Secondary | ICD-10-CM | POA: Diagnosis not present

## 2018-02-11 DIAGNOSIS — Z79899 Other long term (current) drug therapy: Secondary | ICD-10-CM | POA: Diagnosis not present

## 2018-02-24 DIAGNOSIS — E1165 Type 2 diabetes mellitus with hyperglycemia: Secondary | ICD-10-CM | POA: Diagnosis not present

## 2018-03-17 DIAGNOSIS — E1165 Type 2 diabetes mellitus with hyperglycemia: Secondary | ICD-10-CM | POA: Diagnosis not present

## 2018-05-18 DIAGNOSIS — E1165 Type 2 diabetes mellitus with hyperglycemia: Secondary | ICD-10-CM | POA: Diagnosis not present

## 2018-05-31 DIAGNOSIS — F251 Schizoaffective disorder, depressive type: Secondary | ICD-10-CM | POA: Diagnosis not present

## 2018-06-18 ENCOUNTER — Other Ambulatory Visit: Payer: Self-pay | Admitting: Thoracic Surgery (Cardiothoracic Vascular Surgery)

## 2018-08-10 DIAGNOSIS — I10 Essential (primary) hypertension: Secondary | ICD-10-CM | POA: Diagnosis not present

## 2018-08-10 DIAGNOSIS — D649 Anemia, unspecified: Secondary | ICD-10-CM | POA: Diagnosis not present

## 2018-08-10 DIAGNOSIS — R600 Localized edema: Secondary | ICD-10-CM | POA: Diagnosis not present

## 2018-08-10 DIAGNOSIS — E1165 Type 2 diabetes mellitus with hyperglycemia: Secondary | ICD-10-CM | POA: Diagnosis not present

## 2018-08-10 DIAGNOSIS — Z79899 Other long term (current) drug therapy: Secondary | ICD-10-CM | POA: Diagnosis not present

## 2018-08-23 DIAGNOSIS — E1165 Type 2 diabetes mellitus with hyperglycemia: Secondary | ICD-10-CM | POA: Diagnosis not present

## 2018-09-30 DIAGNOSIS — F251 Schizoaffective disorder, depressive type: Secondary | ICD-10-CM | POA: Diagnosis not present

## 2018-10-12 IMAGING — DX DG CHEST 1V PORT
1 series · 1 of 1 positions shown · non-contrast
Comparison: Chest radiograph and CT of the chest performed earlier
today at [DATE] a.m. and [DATE] a.m.

CLINICAL DATA: Acute onset of shortness of breath.

EXAM:
PORTABLE CHEST 1 VIEW

[chest ap]
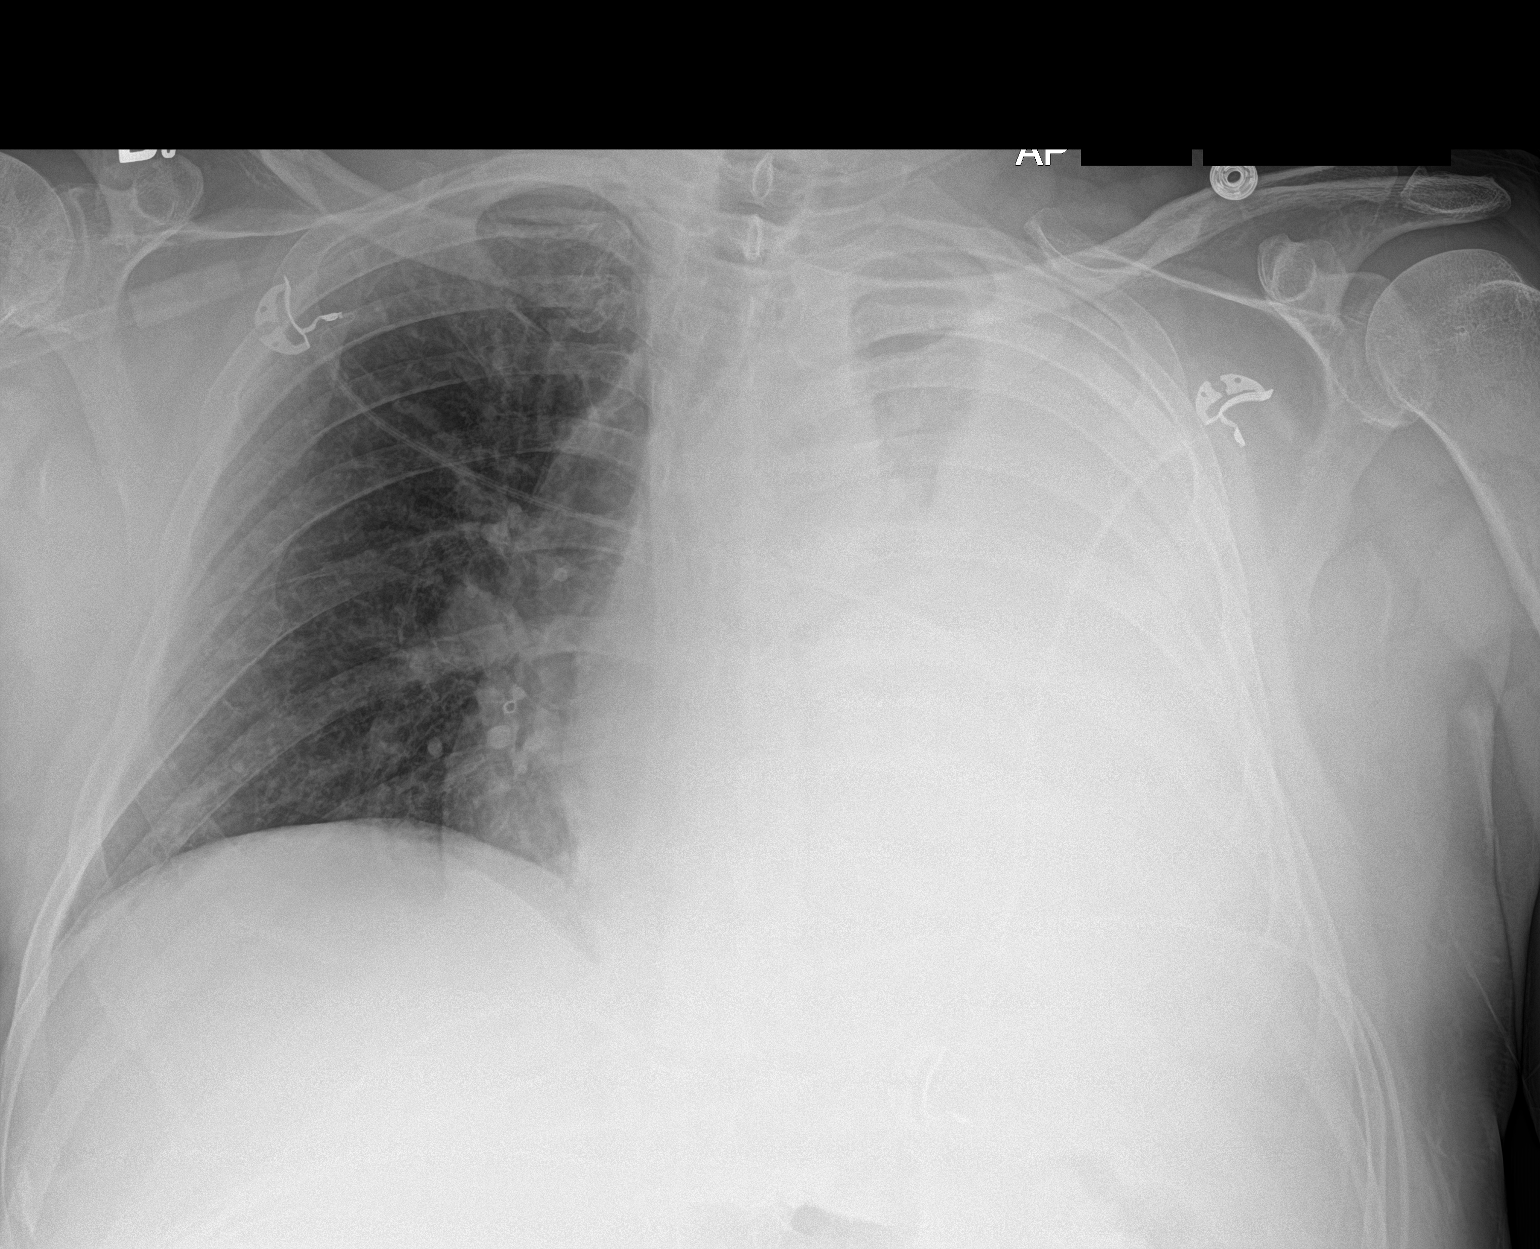

[1 of 1 positions shown; findings below may reference images not displayed]

FINDINGS: A very large left-sided pleural effusion is again noted, with
underlying airspace opacification. Underlying vascular congestion is
noted. No pneumothorax is seen.

The cardiomediastinal silhouette is not well characterized due to
the adjacent pleural effusion. No acute osseous abnormalities are
identified.
IMPRESSION: Very large left-sided pleural effusion again noted, with underlying
airspace opacification. Vascular congestion seen.

## 2018-10-16 IMAGING — DX DG CHEST 1V PORT
1 series · 2 of 2 positions shown · non-contrast
Comparison: 09/26/2017

CLINICAL DATA: Status post VATS.  Evaluate for pneumothorax.

EXAM:
PORTABLE CHEST 1 VIEW

[Series 1: chest ap · 0.14mm/px · 2 of 2 slices shown]
[im 1/2]
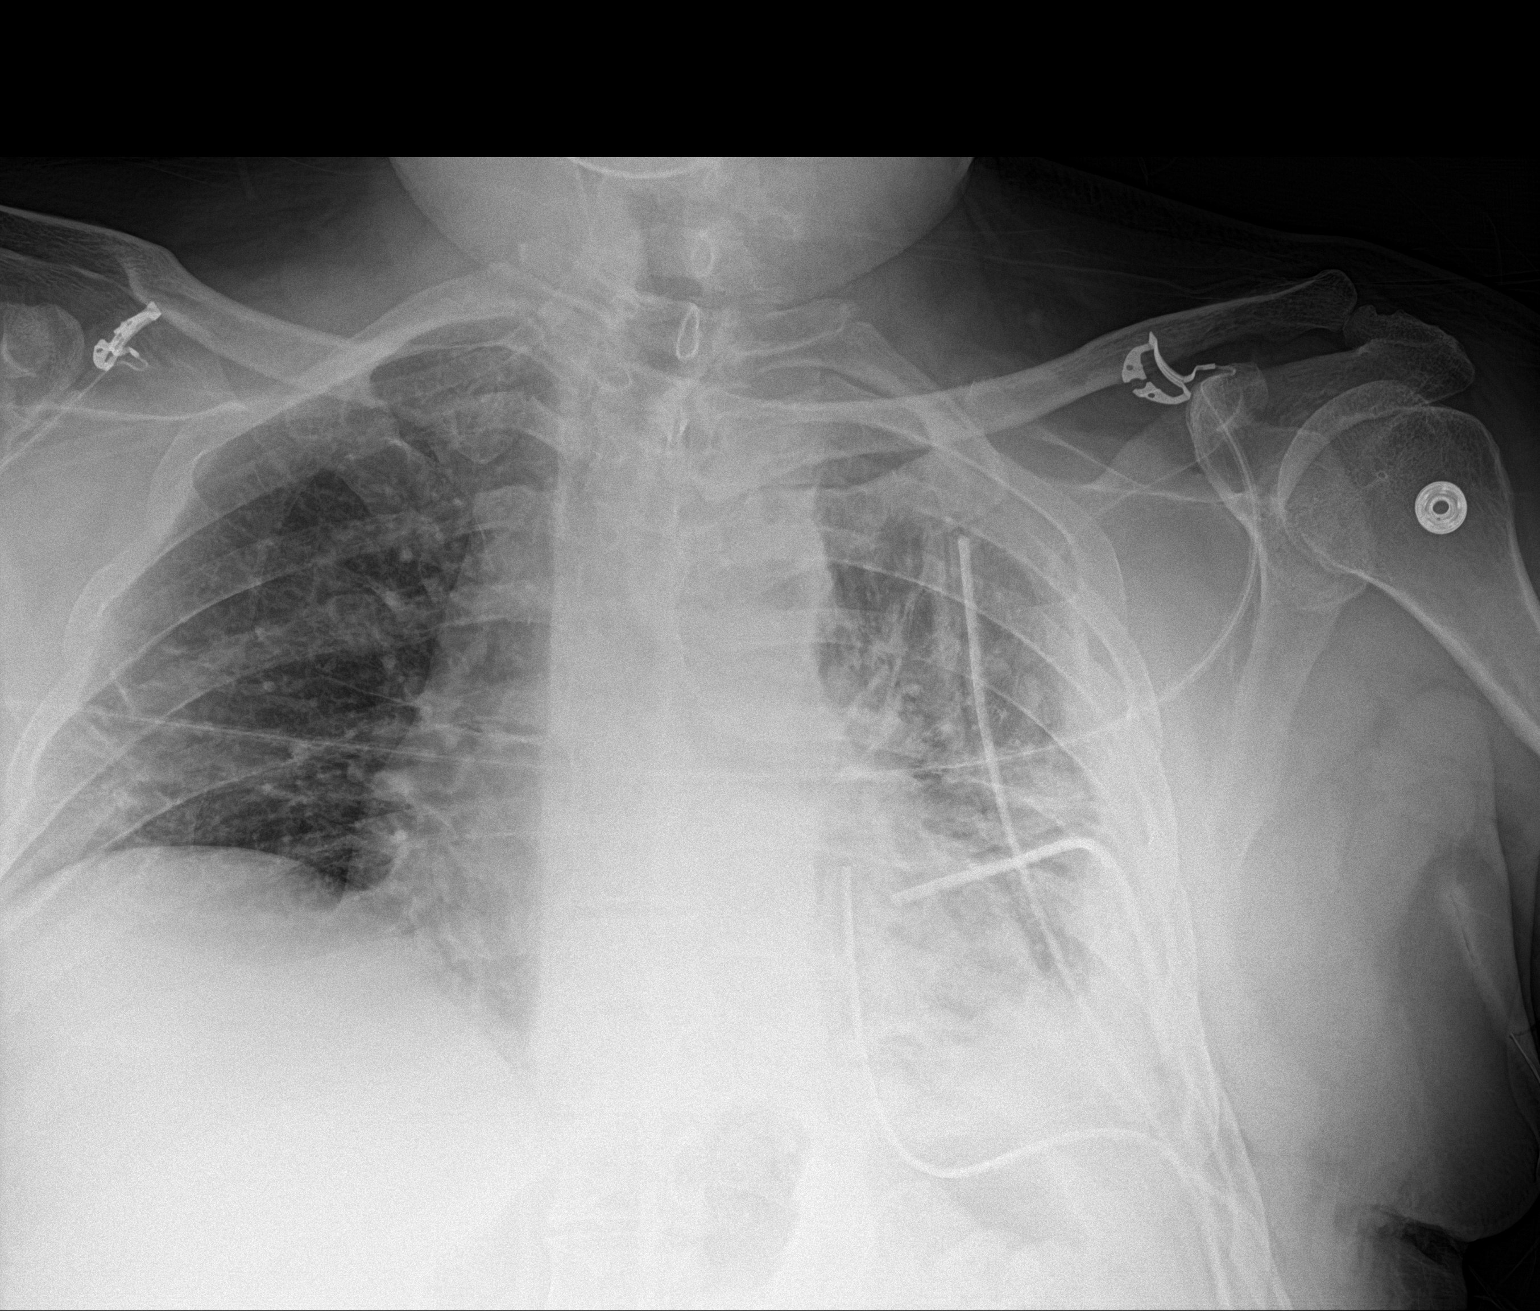
[im 2/2]
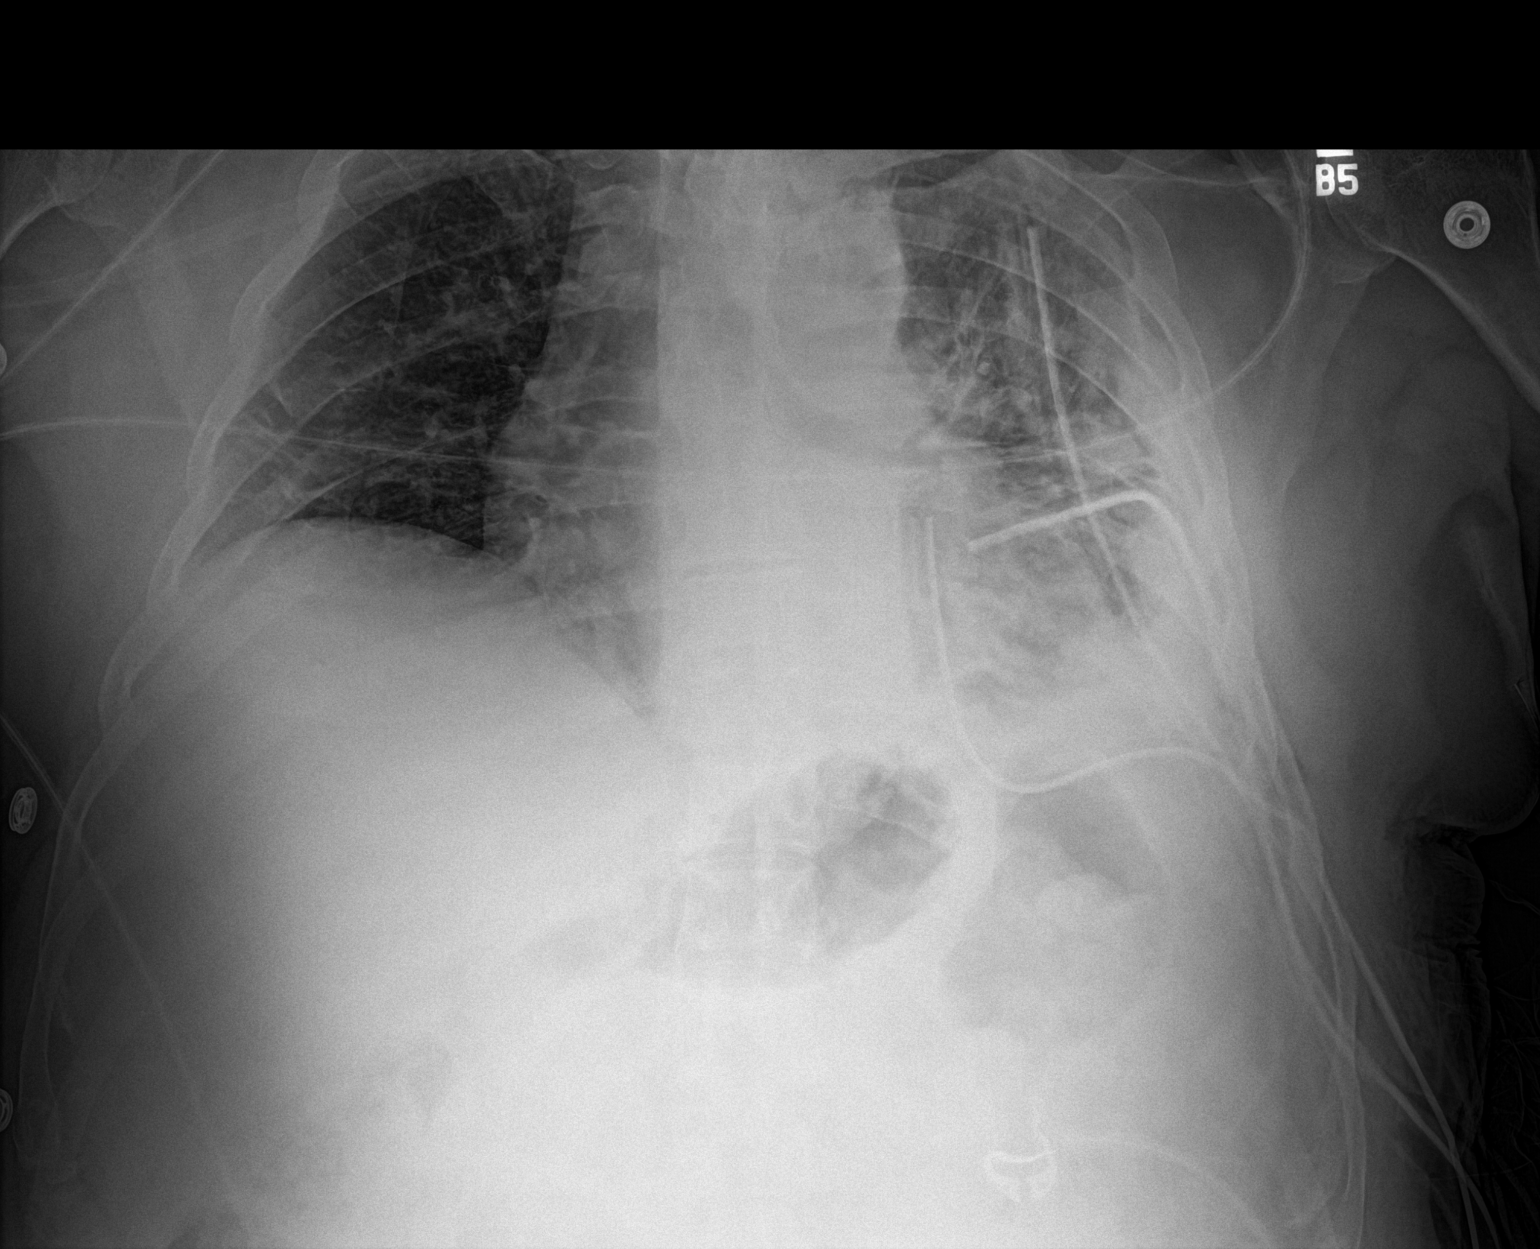

[2 of 2 positions shown; findings below may reference images not displayed]

FINDINGS: There are 3 left-sided chest tubes. There may be a small residual
left-sided hydropneumothorax overlying the lateral aspect of the
left lung measuring 6 mm in thickness. Re-expansion atelectasis
within the right upper lobe and left lower lobe noted. Right lung is
clear.
IMPRESSION: 1. Status post left chest tubes placement. Suspect small residual
left-sided hydropneumothorax measuring approximately 6 mm in
thickness.

## 2018-10-21 IMAGING — DX DG CHEST 1V PORT
1 series · 1 of 1 positions shown · non-contrast
Comparison: Yesterday

CLINICAL DATA: Chest pain and shortness of breath.  Empyema

EXAM:
PORTABLE CHEST 1 VIEW

[chest ap]
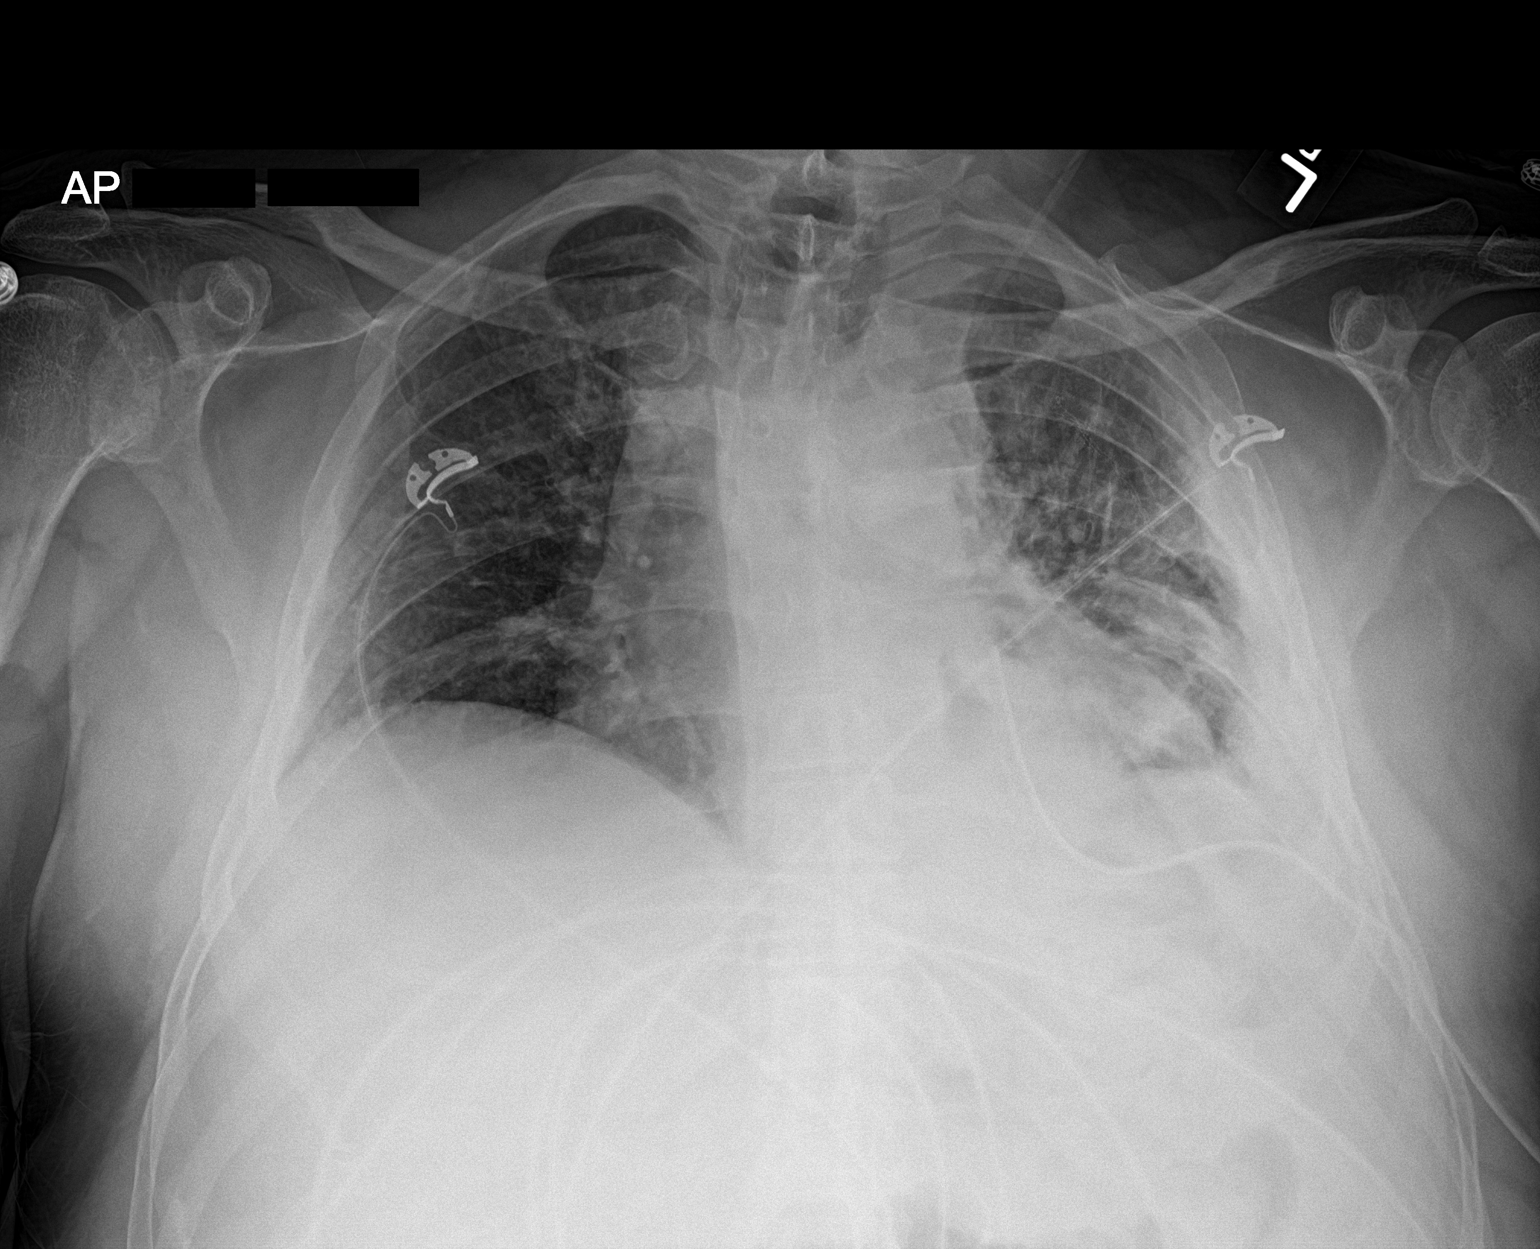

[1 of 1 positions shown; findings below may reference images not displayed]

FINDINGS: Low volume chest, worse on the left where there is streaky pulmonary
opacity and pleural fluid/thickening. Small loculated pneumothorax
at the base. 1 of 2 chest tubes are in stable position. Normal heart
size for technique.
IMPRESSION: Stable postoperative chest on the left. The remaining chest tube is
in stable position. Small loculated pneumothorax at the left base.

## 2018-10-22 IMAGING — CR DG CHEST 2V
2 series · 2 of 2 positions shown · non-contrast
Comparison: Chest x-ray of October 05, 2017

CLINICAL DATA: Admitted 4 days ago for pneumonia. No current
complaints.

EXAM:
CHEST - 2 VIEW

[chest pa]
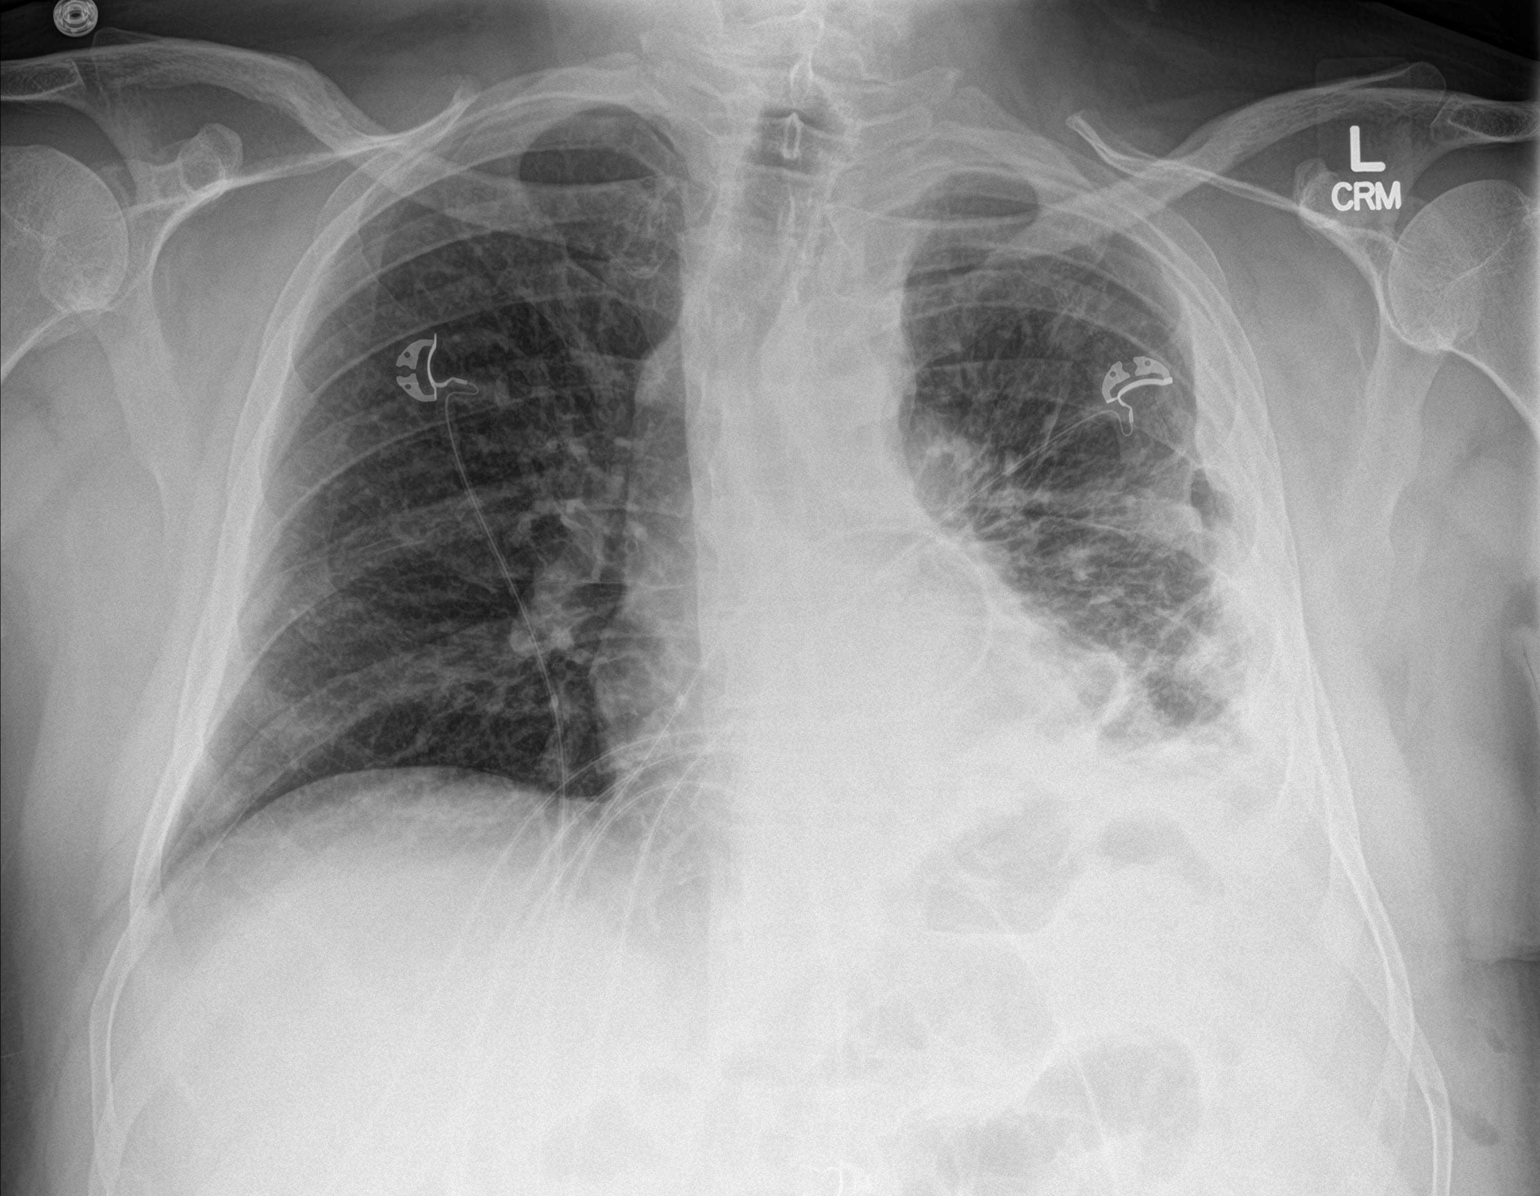

[chest lat]
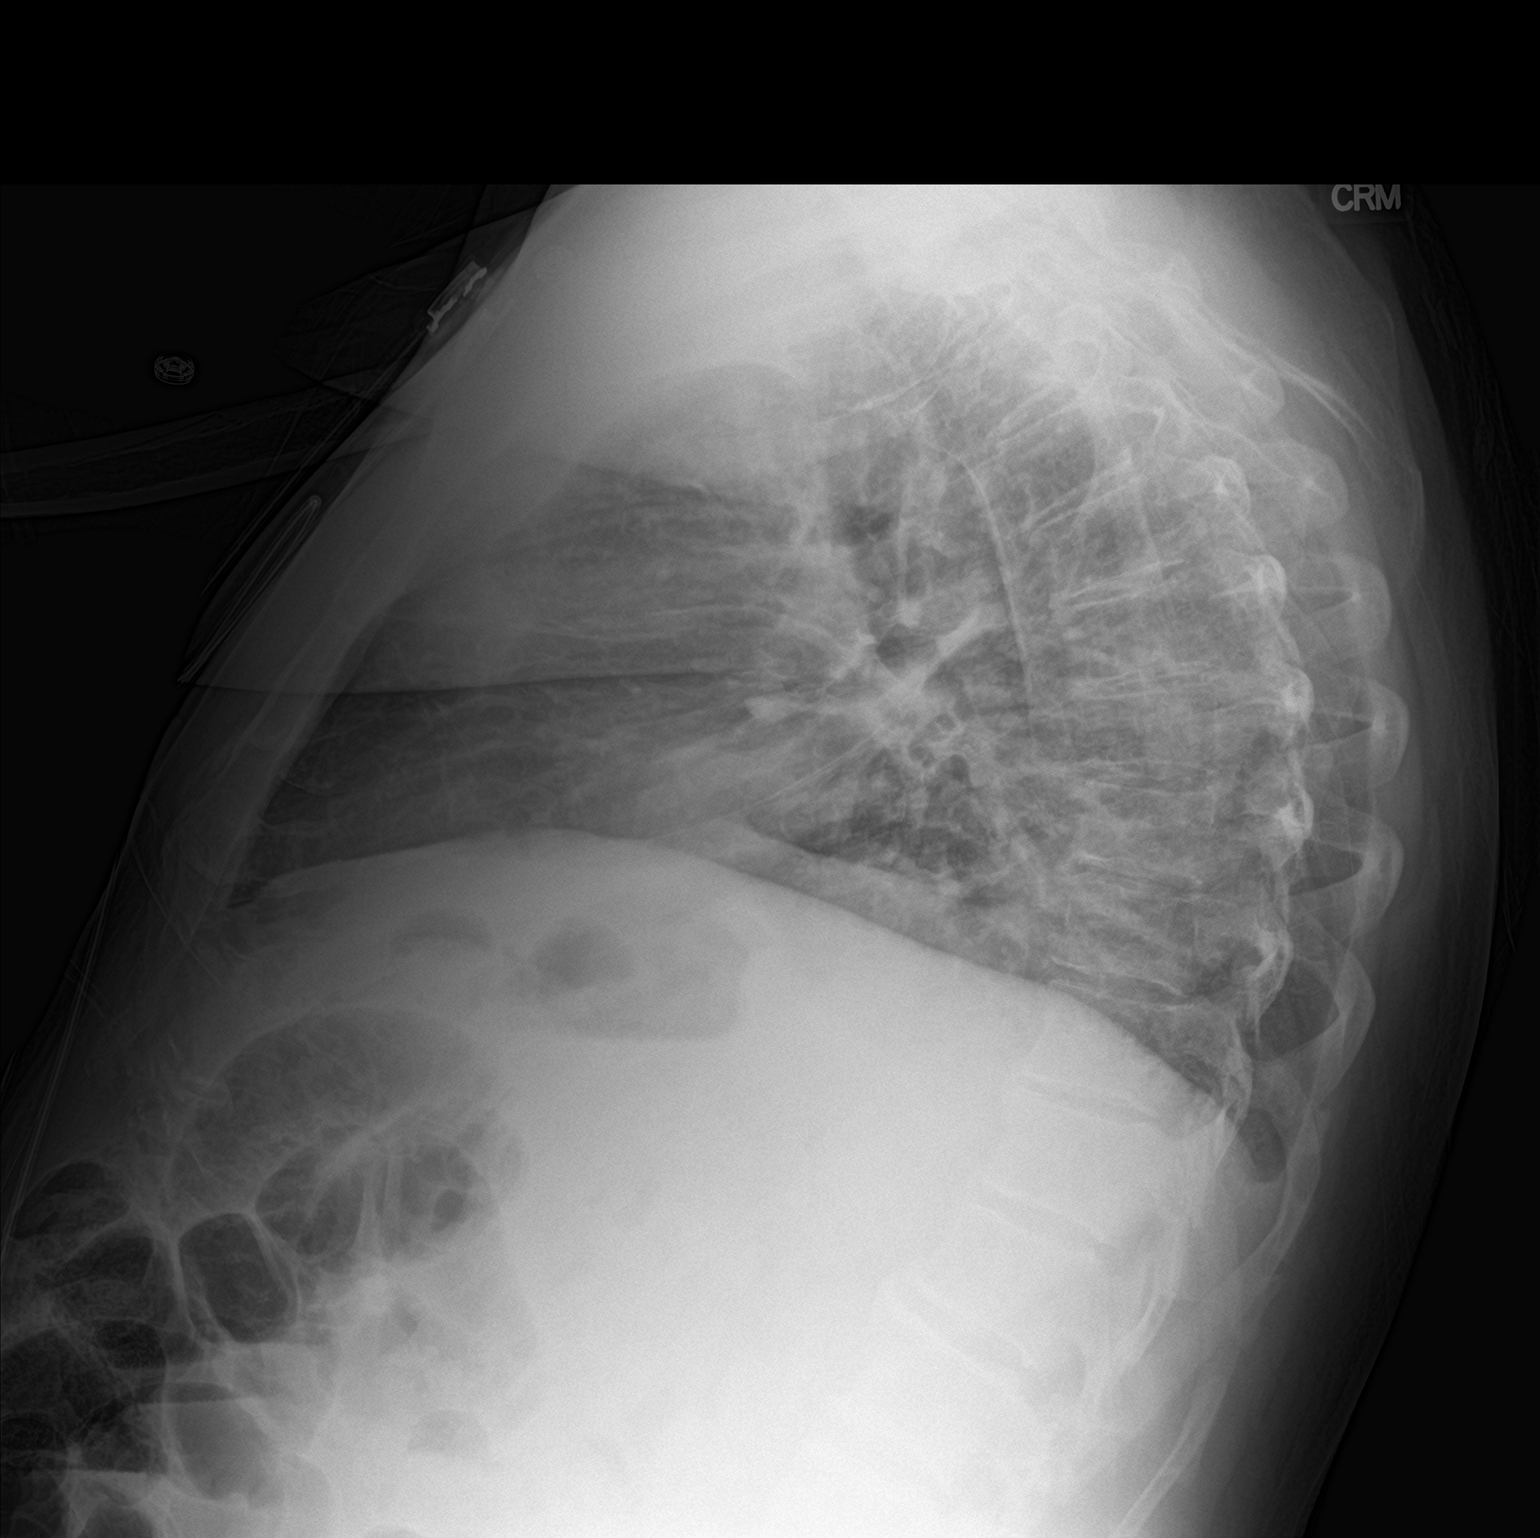

[2 of 2 positions shown; findings below may reference images not displayed]

FINDINGS: The lungs are slightly less well inflated today. There is persistent
increased density in the left lung with mild volume loss. There is
pleural thickening laterally. The heart and pulmonary vascularity
are normal. There is a small left pleural effusion. The observed
bony thorax is unremarkable.
IMPRESSION: Slight interval improvement in the appearance of the left lung.
Persistent mid and lower left interstitial density, small pleural
effusion, and pleural thickening likely related to pneumonia.

## 2019-01-24 DIAGNOSIS — F251 Schizoaffective disorder, depressive type: Secondary | ICD-10-CM | POA: Diagnosis not present

## 2019-01-24 DIAGNOSIS — G4700 Insomnia, unspecified: Secondary | ICD-10-CM | POA: Diagnosis not present

## 2019-01-26 ENCOUNTER — Other Ambulatory Visit: Payer: Self-pay | Admitting: *Deleted

## 2019-01-26 DIAGNOSIS — J90 Pleural effusion, not elsewhere classified: Secondary | ICD-10-CM

## 2019-02-01 ENCOUNTER — Inpatient Hospital Stay: Admission: RE | Admit: 2019-02-01 | Payer: PPO | Source: Ambulatory Visit

## 2019-02-01 ENCOUNTER — Ambulatory Visit: Payer: Self-pay | Admitting: Thoracic Surgery (Cardiothoracic Vascular Surgery)

## 2019-03-15 DIAGNOSIS — R4182 Altered mental status, unspecified: Secondary | ICD-10-CM | POA: Diagnosis not present

## 2019-03-15 DIAGNOSIS — R52 Pain, unspecified: Secondary | ICD-10-CM | POA: Diagnosis not present

## 2019-03-15 DIAGNOSIS — R739 Hyperglycemia, unspecified: Secondary | ICD-10-CM | POA: Diagnosis not present

## 2019-03-15 DIAGNOSIS — F1910 Other psychoactive substance abuse, uncomplicated: Secondary | ICD-10-CM | POA: Diagnosis not present

## 2019-03-15 DIAGNOSIS — R404 Transient alteration of awareness: Secondary | ICD-10-CM | POA: Diagnosis not present

## 2019-03-15 DIAGNOSIS — F29 Unspecified psychosis not due to a substance or known physiological condition: Secondary | ICD-10-CM | POA: Diagnosis not present

## 2019-03-15 DIAGNOSIS — S299XXA Unspecified injury of thorax, initial encounter: Secondary | ICD-10-CM | POA: Diagnosis not present

## 2019-03-15 DIAGNOSIS — E1165 Type 2 diabetes mellitus with hyperglycemia: Secondary | ICD-10-CM | POA: Diagnosis not present

## 2019-06-14 DIAGNOSIS — E1165 Type 2 diabetes mellitus with hyperglycemia: Secondary | ICD-10-CM | POA: Diagnosis not present

## 2019-06-14 DIAGNOSIS — Z79899 Other long term (current) drug therapy: Secondary | ICD-10-CM | POA: Diagnosis not present

## 2019-06-14 DIAGNOSIS — I10 Essential (primary) hypertension: Secondary | ICD-10-CM | POA: Diagnosis not present

## 2019-07-14 DIAGNOSIS — G4700 Insomnia, unspecified: Secondary | ICD-10-CM | POA: Diagnosis not present

## 2019-07-14 DIAGNOSIS — F251 Schizoaffective disorder, depressive type: Secondary | ICD-10-CM | POA: Diagnosis not present

## 2019-10-04 DIAGNOSIS — E119 Type 2 diabetes mellitus without complications: Secondary | ICD-10-CM | POA: Diagnosis not present

## 2019-10-04 DIAGNOSIS — E781 Pure hyperglyceridemia: Secondary | ICD-10-CM | POA: Diagnosis not present

## 2019-11-17 DIAGNOSIS — E119 Type 2 diabetes mellitus without complications: Secondary | ICD-10-CM | POA: Diagnosis not present

## 2019-12-21 DIAGNOSIS — E1165 Type 2 diabetes mellitus with hyperglycemia: Secondary | ICD-10-CM | POA: Diagnosis not present

## 2019-12-21 DIAGNOSIS — I10 Essential (primary) hypertension: Secondary | ICD-10-CM | POA: Diagnosis not present

## 2019-12-21 DIAGNOSIS — Z79899 Other long term (current) drug therapy: Secondary | ICD-10-CM | POA: Diagnosis not present

## 2020-01-12 DIAGNOSIS — I10 Essential (primary) hypertension: Secondary | ICD-10-CM | POA: Diagnosis not present

## 2020-01-12 DIAGNOSIS — E781 Pure hyperglyceridemia: Secondary | ICD-10-CM | POA: Diagnosis not present

## 2020-01-12 DIAGNOSIS — E119 Type 2 diabetes mellitus without complications: Secondary | ICD-10-CM | POA: Diagnosis not present

## 2020-01-16 DIAGNOSIS — F251 Schizoaffective disorder, depressive type: Secondary | ICD-10-CM | POA: Diagnosis not present

## 2020-01-16 DIAGNOSIS — G4700 Insomnia, unspecified: Secondary | ICD-10-CM | POA: Diagnosis not present

## 2020-01-26 DIAGNOSIS — R430 Anosmia: Secondary | ICD-10-CM | POA: Diagnosis not present

## 2020-01-26 DIAGNOSIS — R0602 Shortness of breath: Secondary | ICD-10-CM | POA: Diagnosis not present

## 2020-01-26 DIAGNOSIS — R509 Fever, unspecified: Secondary | ICD-10-CM | POA: Diagnosis not present

## 2020-01-27 DIAGNOSIS — J9600 Acute respiratory failure, unspecified whether with hypoxia or hypercapnia: Secondary | ICD-10-CM | POA: Diagnosis not present

## 2020-01-27 DIAGNOSIS — E1165 Type 2 diabetes mellitus with hyperglycemia: Secondary | ICD-10-CM | POA: Diagnosis not present

## 2020-01-27 DIAGNOSIS — J1282 Pneumonia due to coronavirus disease 2019: Secondary | ICD-10-CM | POA: Diagnosis not present

## 2020-01-27 DIAGNOSIS — Z7984 Long term (current) use of oral hypoglycemic drugs: Secondary | ICD-10-CM | POA: Diagnosis not present

## 2020-01-27 DIAGNOSIS — I10 Essential (primary) hypertension: Secondary | ICD-10-CM | POA: Diagnosis not present

## 2020-01-27 DIAGNOSIS — R059 Cough, unspecified: Secondary | ICD-10-CM | POA: Diagnosis not present

## 2020-01-27 DIAGNOSIS — Z881 Allergy status to other antibiotic agents status: Secondary | ICD-10-CM | POA: Diagnosis not present

## 2020-01-27 DIAGNOSIS — R0602 Shortness of breath: Secondary | ICD-10-CM | POA: Diagnosis not present

## 2020-01-27 DIAGNOSIS — Z9981 Dependence on supplemental oxygen: Secondary | ICD-10-CM | POA: Diagnosis not present

## 2020-01-27 DIAGNOSIS — F209 Schizophrenia, unspecified: Secondary | ICD-10-CM | POA: Diagnosis not present

## 2020-01-27 DIAGNOSIS — J9690 Respiratory failure, unspecified, unspecified whether with hypoxia or hypercapnia: Secondary | ICD-10-CM | POA: Diagnosis not present

## 2020-01-27 DIAGNOSIS — J9601 Acute respiratory failure with hypoxia: Secondary | ICD-10-CM | POA: Diagnosis not present

## 2020-01-27 DIAGNOSIS — U071 COVID-19: Secondary | ICD-10-CM | POA: Diagnosis not present

## 2020-01-27 DIAGNOSIS — E669 Obesity, unspecified: Secondary | ICD-10-CM | POA: Diagnosis not present

## 2020-01-27 DIAGNOSIS — Z87891 Personal history of nicotine dependence: Secondary | ICD-10-CM | POA: Diagnosis not present

## 2020-01-27 DIAGNOSIS — Z6835 Body mass index (BMI) 35.0-35.9, adult: Secondary | ICD-10-CM | POA: Diagnosis not present

## 2020-01-27 DIAGNOSIS — J189 Pneumonia, unspecified organism: Secondary | ICD-10-CM | POA: Diagnosis not present

## 2020-01-27 DIAGNOSIS — A4189 Other specified sepsis: Secondary | ICD-10-CM | POA: Diagnosis not present

## 2020-01-27 DIAGNOSIS — K219 Gastro-esophageal reflux disease without esophagitis: Secondary | ICD-10-CM | POA: Diagnosis not present

## 2020-01-27 DIAGNOSIS — Z79899 Other long term (current) drug therapy: Secondary | ICD-10-CM | POA: Diagnosis not present

## 2020-01-27 DIAGNOSIS — Z880 Allergy status to penicillin: Secondary | ICD-10-CM | POA: Diagnosis not present

## 2020-01-27 DIAGNOSIS — F319 Bipolar disorder, unspecified: Secondary | ICD-10-CM | POA: Diagnosis not present

## 2020-01-27 DIAGNOSIS — Z882 Allergy status to sulfonamides status: Secondary | ICD-10-CM | POA: Diagnosis not present

## 2020-01-27 DIAGNOSIS — J90 Pleural effusion, not elsewhere classified: Secondary | ICD-10-CM | POA: Diagnosis not present

## 2020-03-03 DIAGNOSIS — U071 COVID-19: Secondary | ICD-10-CM | POA: Diagnosis not present

## 2020-04-02 DIAGNOSIS — U071 COVID-19: Secondary | ICD-10-CM | POA: Diagnosis not present

## 2020-05-03 DIAGNOSIS — U071 COVID-19: Secondary | ICD-10-CM | POA: Diagnosis not present

## 2020-07-19 DIAGNOSIS — G4700 Insomnia, unspecified: Secondary | ICD-10-CM | POA: Diagnosis not present

## 2020-07-19 DIAGNOSIS — F251 Schizoaffective disorder, depressive type: Secondary | ICD-10-CM | POA: Diagnosis not present

## 2020-09-03 DIAGNOSIS — E1165 Type 2 diabetes mellitus with hyperglycemia: Secondary | ICD-10-CM | POA: Diagnosis not present

## 2020-09-03 DIAGNOSIS — Z79899 Other long term (current) drug therapy: Secondary | ICD-10-CM | POA: Diagnosis not present

## 2020-09-03 DIAGNOSIS — I10 Essential (primary) hypertension: Secondary | ICD-10-CM | POA: Diagnosis not present

## 2020-09-05 DIAGNOSIS — Z79899 Other long term (current) drug therapy: Secondary | ICD-10-CM | POA: Diagnosis not present

## 2020-09-13 DIAGNOSIS — E1165 Type 2 diabetes mellitus with hyperglycemia: Secondary | ICD-10-CM | POA: Diagnosis not present

## 2020-09-13 DIAGNOSIS — E7801 Familial hypercholesterolemia: Secondary | ICD-10-CM | POA: Diagnosis not present

## 2020-11-12 DIAGNOSIS — G4700 Insomnia, unspecified: Secondary | ICD-10-CM | POA: Diagnosis not present

## 2020-11-12 DIAGNOSIS — F251 Schizoaffective disorder, depressive type: Secondary | ICD-10-CM | POA: Diagnosis not present

## 2020-12-12 DIAGNOSIS — E119 Type 2 diabetes mellitus without complications: Secondary | ICD-10-CM | POA: Diagnosis not present

## 2021-02-25 DIAGNOSIS — G4700 Insomnia, unspecified: Secondary | ICD-10-CM | POA: Diagnosis not present

## 2021-02-25 DIAGNOSIS — F251 Schizoaffective disorder, depressive type: Secondary | ICD-10-CM | POA: Diagnosis not present

## 2021-03-04 DIAGNOSIS — E7801 Familial hypercholesterolemia: Secondary | ICD-10-CM | POA: Diagnosis not present

## 2021-03-04 DIAGNOSIS — I10 Essential (primary) hypertension: Secondary | ICD-10-CM | POA: Diagnosis not present

## 2021-03-04 DIAGNOSIS — Z79899 Other long term (current) drug therapy: Secondary | ICD-10-CM | POA: Diagnosis not present

## 2021-04-22 NOTE — Progress Notes (Signed)
Cardiology Office Note:    Date:  04/25/2021   ID:  Jeremy, Dillon 12-Mar-1971, MRN [id number]  PCP:  Enid Skeens., MD  Cardiologist:  None  Electrophysiologist:  None   Referring MD: Enid Skeens., MD   Chief Complaint  Patient presents with   Hyperlipidemia    History of Present Illness:    Jeremy Dillon is a 51 y.o. male with a hx of hypertension, hyperlipidemia, T2DM, schizophrenia, bipolar disorder, tobacco use, empyema status post VATS 09/2017 who is referred by Dr. Wendie Agreste for evaluation of hyperlipidemia.  Triglycerides 1291 on 06/14/2019, started fenofibrate 200 mg daily.  Triglycerides 03/05/2021 showed triglycerides 838.  He denies any chest pain, dyspnea, lightheadedness, syncope, or palpitations.  Reports occasional lower extremity edema.  Reports his heart rate is always elevated.  Will be up to 120s when he checks at home at rest.  Does report he snores.  Smoked for 10 years up to 1 pack/day, quit at age 51.  Family history includes maternal grandmother died of MI in 78s    Past Medical History:  Diagnosis Date   Anemia    Anxiety    Bipolar disorder ([place])    Depression    Empyema lung ([place]) 09/2017   Left VATS June 2019   GERD (gastroesophageal reflux disease)    Hypertension    Pneumonia    Schizophrenia ([place])    Smoking history 09/25/2017   vapes    Past Surgical History:  Procedure Laterality Date   PLEURAL EFFUSION DRAINAGE Left 09/30/2017   Procedure: DRAINAGE OF PLEURAL EFFUSION;  Surgeon: Melrose Nakayama, MD;  Location: [facility];  Service: Thoracic;  Laterality: Left;   shoulder surgery     VIDEO ASSISTED THORACOSCOPY (VATS)/DECORTICATION Left 09/30/2017   Procedure: VIDEO ASSISTED THORACOSCOPY (VATS)/DECORTICATION;  Surgeon: Melrose Nakayama, MD;  Location: [facility];  Service: Thoracic;  Laterality: Left;   VIDEO BRONCHOSCOPY N/A 09/30/2017   Procedure: VIDEO BRONCHOSCOPY;  Surgeon: Melrose Nakayama, MD;  Location: [facility];   Service: Thoracic;  Laterality: N/A;    Current Medications: Current Meds  Medication Sig   amLODipine (NORVASC) 10 MG tablet Take 10 mg by mouth daily in the afternoon.   fenofibrate micronized (LOFIBRA) 200 MG capsule Take 200 mg by mouth daily.   gabapentin (NEURONTIN) 300 MG capsule Take 300 mg by mouth 3 (three) times daily.   glipiZIDE (GLUCOTROL) 10 MG tablet Take 10 mg by mouth 2 (two) times daily.   ibuprofen (ADVIL,MOTRIN) 200 MG tablet Take 200 mg by mouth every 6 (six) hours as needed. Shoulder pain   icosapent Ethyl (VASCEPA) 1 g capsule Take 2 capsules (2 g total) by mouth 2 (two) times daily.   JARDIANCE 25 MG TABS tablet Take 25 mg by mouth daily.   lisinopril (ZESTRIL) 5 MG tablet Take 5 mg by mouth daily.   metFORMIN (GLUCOPHAGE) 1000 MG tablet Take 1,000 mg by mouth 2 (two) times daily.   metoprolol tartrate (LOPRESSOR) 25 MG tablet TAKE 1 TABLET BY MOUTH TWICE A DAY   OLANZapine (ZYPREXA) 20 MG tablet Take 20 mg by mouth at bedtime.   ONETOUCH ULTRA test strip USE TO TEST ONCE DAILY   pioglitazone (ACTOS) 45 MG tablet Take 45 mg by mouth daily.   QUEtiapine (SEROQUEL) 50 MG tablet Take 50-100 mg by mouth at bedtime.   venlafaxine XR (EFFEXOR-XR) 75 MG 24 hr capsule Take 225 mg by mouth daily.   zolpidem (AMBIEN) 10 MG tablet Take  10 mg by mouth at bedtime.     Allergies:   Erythromycin, Penicillins, and Sulfa antibiotics   Social History   Socioeconomic History   Marital status: Single    Spouse name: Not on file   Number of children: Not on file   Years of education: Not on file   Highest education level: Not on file  Occupational History   Not on file  Tobacco Use   Smoking status: Former   Smokeless tobacco: Current   Tobacco comments:    vapes  Vaping Use   Vaping Use: Every day  Substance and Sexual Activity   Alcohol use: Yes   Drug use: Yes    Types: Marijuana   Sexual activity: Not on file  Other Topics Concern   Not on file  Social  History Narrative   Not on file   Social Determinants of Health   Financial Resource Strain: Not on file  Food Insecurity: Not on file  Transportation Needs: Not on file  Physical Activity: Not on file  Stress: Not on file  Social Connections: Not on file     Family History: The patient's family history includes Breast cancer in his paternal grandmother; Diabetes Mellitus II in his brother; Prostate cancer in his paternal grandfather.  ROS:   Please see the history of present illness.     All other systems reviewed and are negative.  EKGs/Labs/Other Studies Reviewed:    The following studies were reviewed today:   EKG:   04/25/21: Sinus tachycardia, rate 116, septal Q waves  Recent Labs: No results found for requested labs within last 8760 hours.  Recent Lipid Panel No results found for: CHOL, TRIG, HDL, CHOLHDL, VLDL, LDLCALC, LDLDIRECT  Physical Exam:    VS:  BP 112/68 (BP Location: Left Arm, Patient Position: Sitting, Cuff Size: Large)    Pulse (!) 116    Ht 5\' 9"  (1.753 m)    Wt 254 lb 6.4 oz (115.4 kg)    SpO2 90%    BMI 37.57 kg/m     Wt Readings from Last 3 Encounters:  04/25/21 254 lb 6.4 oz (115.4 kg)  10/27/17 210 lb (95.3 kg)  10/02/17 230 lb 9.6 oz (104.6 kg)     GEN:  Well nourished, well developed in no acute distress HEENT: Normal NECK: No JVD; No carotid bruits LYMPHATICS: No lymphadenopathy CARDIAC: tachycardic, no murmurs, rubs, gallops RESPIRATORY:  Clear to auscultation without rales, wheezing or rhonchi  ABDOMEN: Soft, non-tender, non-distended MUSCULOSKELETAL:  No edema; No deformity  SKIN: Warm and dry NEUROLOGIC:  Alert and oriented x 3 PSYCHIATRIC:  Normal affect   ASSESSMENT:    1. Pure hypertriglyceridemia   2. Tachycardia   3. Nonspecific abnormal electrocardiogram (ECG) (EKG)   4. Snoring    PLAN:    Hyperlipidemia: On fenofibrate 200 mg daily.  Triglycerides 838 on 02/2021, LDL unable to be calculated.  Has been as high as  1291 on 06/15/2019 -Add Vascepa 2g BID.  Recheck fasting lipid panel in 2 months -Check calcium score to guide how aggressive to be in treating cholesterol  Abnormal EKG: Septal Q waves and sinus tachycardia.  Will check echocardiogram.  Tachycardia: Reports heart rate is always elevated, will be up to 120s when he checks at home at rest.  Will check Zio patch x3 days  T2DM: On Jardiance, metformin, glipizide, Actos.  Follows with endocrinology.  Discussed importance of better glucose control in treating his hypertriglyceridemia  Snoring:Body mass index is 37.57  kg/m.  Recommend sleep study to evaluate for OSA   RTC in 6 months  Medication Adjustments/Labs and Tests Ordered: Current medicines are reviewed at length with the patient today.  Concerns regarding medicines are outlined above.  Orders Placed This Encounter  Procedures   CT CARDIAC SCORING (SELF PAY ONLY)   Lipid panel   Comprehensive Metabolic Panel (CMET)   LONG TERM MONITOR (3-14 DAYS)   EKG 12-Lead   ECHOCARDIOGRAM COMPLETE   Nocturnal polysomnography (NPSG)   Meds ordered this encounter  Medications   icosapent Ethyl (VASCEPA) 1 g capsule    Sig: Take 2 capsules (2 g total) by mouth 2 (two) times daily.    Dispense:  120 capsule    Refill:  11    Patient Instructions  Medication Instructions:   START ICOSAPENT ETHYL 2 GM TWICE DAILY= 2 TABLETS TWICE DAILY  *If you need a refill on your cardiac medications before your next appointment, please call your pharmacy*   Lab Work:  Your physician recommends that you return for lab work in: 2 MONTHS-FASTING  If you have labs (blood work) drawn today and your tests are completely normal, you will receive your results only by: [place] (if you have MyChart) OR A paper copy in the mail If you have any lab test that is abnormal or we need to change your treatment, we will call you to review the results.   Testing/Procedures:  CORONARY CALCIUM SCORE CT  AT [facility]  Your physician has requested that you have an echocardiogram. Echocardiography is a painless test that uses sound waves to create images of your heart. It provides your doctor with information about the size and shape of your heart and how well your hearts chambers and valves are working. This procedure takes approximately one hour. There are no restrictions for this procedure. [place] Instructions  Your physician has requested you wear a ZIO patch monitor for 3 days.  This is a single patch monitor. [employer] supplies one patch monitor per enrollment. Additional stickers are not available. Please do not apply patch if you will be having a Nuclear Stress Test,  Echocardiogram, Cardiac CT, MRI, or Chest Xray during the period you would be wearing the  monitor. The patch cannot be worn during these tests. You cannot remove and re-apply the  ZIO XT patch monitor.  Your ZIO patch monitor will be mailed 3 day USPS to your address on file. It may take 3-5 days  to receive your monitor after you have been enrolled.  Once you have received your monitor, please review the enclosed instructions. Your monitor  has already been registered assigning a specific monitor serial # to you.  Billing and Patient Assistance Program Information  We have supplied [employer] with any of your insurance information on file for billing purposes. [employer] offers a sliding scale Patient Assistance Program for patients that do not have  insurance, or whose insurance does not completely cover the cost of the ZIO monitor.  You must apply for the Patient Assistance Program to qualify for this discounted rate.  To apply, please call [employer] at [phone], select option 4, select option 2, ask to apply for  Patient Assistance Program. Theodore Demark will ask your household income, and how many people  are in your household. They will quote your out-of-pocket cost  based on that information.  [employer] will also be able to set up a 39-month, interest-free payment  plan if needed.  Applying the monitor   Shave hair from upper left chest.  Hold abrader disc by orange tab. Rub abrader in 40 strokes over the upper left chest as  indicated in your monitor instructions.  Clean area with 4 enclosed alcohol pads. Let dry.  Apply patch as indicated in monitor instructions. Patch will be placed under collarbone on left  side of chest with arrow pointing upward.  Rub patch adhesive wings for 2 minutes. Remove white label marked "1". Remove the white  label marked "2". Rub patch adhesive wings for 2 additional minutes.  While looking in a mirror, press and release button in center of patch. A small green light will  flash 3-4 times. This will be your only indicator that the monitor has been turned on.  Do not shower for the first 24 hours. You may shower after the first 24 hours.  Press the button if you feel a symptom. You will hear a small click. Record Date, Time and  Symptom in the Patient Logbook.  When you are ready to remove the patch, follow instructions on the last 2 pages of Patient  Logbook. Stick patch monitor onto the last page of Patient Logbook.  Place Patient Logbook in the blue and white box. Use locking tab on box and tape box closed  securely. The blue and white box has prepaid postage on it. Please place it in the mailbox as  soon as possible. Your physician should have your test results approximately 7 days after the  monitor has been mailed back to [facility]  Call [place] at [phone] if you have questions regarding  your ZIO XT patch monitor. Call them immediately if you see an orange light blinking on your  monitor.  If your monitor falls off in less than 4 days, contact our Monitor department at [phone].  If your monitor becomes loose or falls off after 4 days call [employer] at [phone] for   suggestions on securing your monitor   Your physician has recommended that you have a sleep study. This test records several body functions during sleep, including: brain activity, eye movement, oxygen and carbon dioxide blood levels, heart rate and rhythm, breathing rate and rhythm, the flow of air through your mouth and nose, snoring, body muscle movements, and chest and belly movement. [place]   Follow-Up: At [facility], you and your health needs are our priority.  As part of our continuing mission to provide you with exceptional heart care, we have created designated Provider Care Teams.  These Care Teams include your primary Cardiologist (physician) and Advanced Practice Providers (APPs -  Physician Assistants and Nurse Practitioners) who all work together to provide you with the care you need, when you need it.  We recommend signing up for the patient portal called "MyChart".  Sign up information is provided on this After Visit Summary.  MyChart is used to connect with patients for Virtual Visits (Telemedicine).  Patients are able to view lab/test results, encounter notes, upcoming appointments, etc.  Non-urgent messages can be sent to your provider as well.   To learn more about what you can do with MyChart, go to [email or web address].    Your next appointment:   6 month(s)  The format for your next appointment:   In Person  Provider:   Oswaldo Milian MD       Signed, Donato Heinz, MD  04/25/2021 11:20 AM    [facility]

## 2021-04-25 ENCOUNTER — Encounter: Payer: Self-pay | Admitting: Cardiology

## 2021-04-25 ENCOUNTER — Other Ambulatory Visit: Payer: Self-pay

## 2021-04-25 ENCOUNTER — Ambulatory Visit: Payer: HMO | Admitting: Cardiology

## 2021-04-25 ENCOUNTER — Ambulatory Visit (INDEPENDENT_AMBULATORY_CARE_PROVIDER_SITE_OTHER): Payer: HMO

## 2021-04-25 VITALS — BP 112/68 | HR 116 | Ht 69.0 in | Wt 254.4 lb

## 2021-04-25 DIAGNOSIS — R000 Tachycardia, unspecified: Secondary | ICD-10-CM | POA: Diagnosis not present

## 2021-04-25 DIAGNOSIS — E781 Pure hyperglyceridemia: Secondary | ICD-10-CM

## 2021-04-25 DIAGNOSIS — R9431 Abnormal electrocardiogram [ECG] [EKG]: Secondary | ICD-10-CM

## 2021-04-25 DIAGNOSIS — R0683 Snoring: Secondary | ICD-10-CM | POA: Diagnosis not present

## 2021-04-25 MED ORDER — ICOSAPENT ETHYL 1 G PO CAPS: 2.0000 g | ORAL_CAPSULE | Freq: Two times a day (BID) | ORAL | 11 refills | Status: AC

## 2021-04-25 NOTE — Patient Instructions (Signed)
Medication Instructions:   START ICOSAPENT ETHYL 2 GM TWICE DAILY= 2 TABLETS TWICE DAILY  *If you need a refill on your cardiac medications before your next appointment, please call your pharmacy*   Lab Work:  Your physician recommends that you return for lab work in: 2 MONTHS-FASTING  If you have labs (blood work) drawn today and your tests are completely normal, you will receive your results only by: [place] (if you have MyChart) OR A paper copy in the mail If you have any lab test that is abnormal or we need to change your treatment, we will call you to review the results.   Testing/Procedures:  CORONARY CALCIUM SCORE CT AT [place]  Your physician has requested that you have an echocardiogram. Echocardiography is a painless test that uses sound waves to create images of your heart. It provides your doctor with information about the size and shape of your heart and how well your hearts chambers and valves are working. This procedure takes approximately one hour. There are no restrictions for this procedure. [place] Instructions  Your physician has requested you wear a ZIO patch monitor for 3 days.  This is a single patch monitor. [employer] supplies one patch monitor per enrollment. Additional stickers are not available. Please do not apply patch if you will be having a Nuclear Stress Test,  Echocardiogram, Cardiac CT, MRI, or Chest Xray during the period you would be wearing the  monitor. The patch cannot be worn during these tests. You cannot remove and re-apply the  ZIO XT patch monitor.  Your ZIO patch monitor will be mailed 3 day USPS to your address on file. It may take 3-5 days  to receive your monitor after you have been enrolled.  Once you have received your monitor, please review the enclosed instructions. Your monitor  has already been registered assigning a specific monitor serial # to you.  Billing  and Patient Assistance Program Information  We have supplied [employer] with any of your insurance information on file for billing purposes. [employer] offers a sliding scale Patient Assistance Program for patients that do not have  insurance, or whose insurance does not completely cover the cost of the ZIO monitor.  You must apply for the Patient Assistance Program to qualify for this discounted rate.  To apply, please call [employer] at [phone], select option 4, select option 2, ask to apply for  Patient Assistance Program. Theodore Demark will ask your household income, and how many people  are in your household. They will quote your out-of-pocket cost based on that information.  [employer] will also be able to set up a 13-month, interest-free payment plan if needed.  Applying the monitor   Shave hair from upper left chest.  Hold abrader disc by orange tab. Rub abrader in 40 strokes over the upper left chest as  indicated in your monitor instructions.  Clean area with 4 enclosed alcohol pads. Let dry.  Apply patch as indicated in monitor instructions. Patch will be placed under collarbone on left  side of chest with arrow pointing upward.  Rub patch adhesive wings for 2 minutes. Remove white label marked "1". Remove the white  label marked "2". Rub patch adhesive wings for 2 additional minutes.  While looking in a mirror, press and release button in center of patch. A small green light will  flash 3-4 times. This will be your only indicator that the monitor has been  turned on.  Do not shower for the first 24 hours. You may shower after the first 24 hours.  Press the button if you feel a symptom. You will hear a small click. Record Date, Time and  Symptom in the Patient Logbook.  When you are ready to remove the patch, follow instructions on the last 2 pages of Patient  Logbook. Stick patch monitor onto the last page of Patient Logbook.  Place Patient Logbook in the blue and white box. Use locking tab  on box and tape box closed  securely. The blue and white box has prepaid postage on it. Please place it in the mailbox as  soon as possible. Your physician should have your test results approximately 7 days after the  monitor has been mailed back to [facility].  Call [facility] at [phone] if you have questions regarding  your ZIO XT patch monitor. Call them immediately if you see an orange light blinking on your  monitor.  If your monitor falls off in less than 4 days, contact our Monitor department at [phone].  If your monitor becomes loose or falls off after 4 days call [employer] at [phone] for  suggestions on securing your monitor   Your physician has recommended that you have a sleep study. This test records several body functions during sleep, including: brain activity, eye movement, oxygen and carbon dioxide blood levels, heart rate and rhythm, breathing rate and rhythm, the flow of air through your mouth and nose, snoring, body muscle movements, and chest and belly movement. [place]   Follow-Up: At [facility], you and your health needs are our priority.  As part of our continuing mission to provide you with exceptional heart care, we have created designated Provider Care Teams.  These Care Teams include your primary Cardiologist (physician) and Advanced Practice Providers (APPs -  Physician Assistants and Nurse Practitioners) who all work together to provide you with the care you need, when you need it.  We recommend signing up for the patient portal called "MyChart".  Sign up information is provided on this After Visit Summary.  MyChart is used to connect with patients for Virtual Visits (Telemedicine).  Patients are able to view lab/test results, encounter notes, upcoming appointments, etc.  Non-urgent messages can be sent to your provider as well.   To learn more about what you can do with MyChart, go to [email or web address].     Your next appointment:   6 month(s)  The format for your next appointment:   In Person  Provider:   Oswaldo Milian MD

## 2021-04-25 NOTE — Progress Notes (Unsigned)
Enrolled for [employer] to mail a ZIO XT long term holter monitor to the patients address on file.  

## 2021-04-29 DIAGNOSIS — R000 Tachycardia, unspecified: Secondary | ICD-10-CM | POA: Diagnosis not present

## 2021-05-07 ENCOUNTER — Telehealth: Payer: Self-pay | Admitting: *Deleted

## 2021-05-07 NOTE — Telephone Encounter (Signed)
-----   Message from Cristopher Estimable, RN sent at 04/25/2021  9:48 AM EST ----- DR [place]

## 2021-05-07 NOTE — Telephone Encounter (Signed)
Prior Authorization for [employer] sent to [employer] via Fax  [phone].

## 2021-05-09 ENCOUNTER — Ambulatory Visit (HOSPITAL_COMMUNITY): Payer: HMO | Attending: Cardiology

## 2021-05-09 ENCOUNTER — Other Ambulatory Visit: Payer: Self-pay

## 2021-05-09 ENCOUNTER — Ambulatory Visit
Admission: RE | Admit: 2021-05-09 | Discharge: 2021-05-09 | Disposition: A | Discharge status: Home / Self Care | Payer: Self-pay | Source: Ambulatory Visit | Attending: Cardiology | Admitting: Cardiology

## 2021-05-09 DIAGNOSIS — R9431 Abnormal electrocardiogram [ECG] [EKG]: Secondary | ICD-10-CM | POA: Insufficient documentation

## 2021-05-09 DIAGNOSIS — E781 Pure hyperglyceridemia: Secondary | ICD-10-CM

## 2021-05-09 LAB — ECHOCARDIOGRAM COMPLETE
Area-P 1/2: 6.43 cm2
S' Lateral: 3.3 cm

## 2021-05-10 ENCOUNTER — Telehealth: Payer: Self-pay | Admitting: Cardiology

## 2021-05-10 DIAGNOSIS — K760 Fatty (change of) liver, not elsewhere classified: Secondary | ICD-10-CM

## 2021-05-10 NOTE — Telephone Encounter (Signed)
Returned call to patient, results for Echo and Calcium Score given to patient.   Order for LFT's placed. Patient aware of all instructions.   Donato Heinz, MD  05/10/2021  6:45 AM EST     No calcified plaque in heart arteries   Severe steatosis of the liver, recommend checking LFTs   Donato Heinz, MD  05/10/2021  6:45 AM EST     No significant abnormalities

## 2021-05-10 NOTE — Telephone Encounter (Signed)
Patient returned call for test results.  °

## 2021-05-22 NOTE — Telephone Encounter (Signed)
Patient notified of sleep appointment scheduled  on 06/19/21.

## 2021-05-22 NOTE — Telephone Encounter (Signed)
Approval received  from [employer] for [place]. Auth # [id number]. Valid date 05/22/21 to 08/20/21.

## 2021-06-19 ENCOUNTER — Encounter (HOSPITAL_BASED_OUTPATIENT_CLINIC_OR_DEPARTMENT_OTHER): Payer: HMO | Admitting: Cardiovascular Disease

## 2021-06-20 LAB — HEPATIC FUNCTION PANEL
ALT: 28 IU/L (ref 0–44)
AST: 20 IU/L (ref 0–40)
Albumin: 4.5 g/dL (ref 4.0–5.0)
Alkaline Phosphatase: 59 IU/L (ref 44–121)
Bilirubin Total: 0.3 mg/dL (ref 0.0–1.2)
Bilirubin, Direct: 0.14 mg/dL (ref 0.00–0.40)
Total Protein: 6.7 g/dL (ref 6.0–8.5)

## 2021-06-21 ENCOUNTER — Telehealth: Payer: Self-pay | Admitting: Cardiology

## 2021-06-21 NOTE — Telephone Encounter (Signed)
Patient aware of lab results and verbalized understanding.

## 2021-06-21 NOTE — Telephone Encounter (Signed)
Patient returning call to discuss lab results  ?
